# Patient Record
Sex: Male | Born: 1970 | Race: White | Hispanic: No | Marital: Married | Smoking: Former smoker
Health system: Southern US, Community
[De-identification: ages and names within clinical notes are randomized; demographics above are authoritative.]

## PROBLEM LIST (undated history)

## (undated) DIAGNOSIS — F419 Anxiety disorder, unspecified: Secondary | ICD-10-CM

## (undated) DIAGNOSIS — J189 Pneumonia, unspecified organism: Secondary | ICD-10-CM

## (undated) DIAGNOSIS — I10 Essential (primary) hypertension: Secondary | ICD-10-CM

## (undated) DIAGNOSIS — K4020 Bilateral inguinal hernia, without obstruction or gangrene, not specified as recurrent: Secondary | ICD-10-CM

## (undated) DIAGNOSIS — K219 Gastro-esophageal reflux disease without esophagitis: Secondary | ICD-10-CM

## (undated) DIAGNOSIS — Z87442 Personal history of urinary calculi: Secondary | ICD-10-CM

## (undated) DIAGNOSIS — J449 Chronic obstructive pulmonary disease, unspecified: Secondary | ICD-10-CM

## (undated) DIAGNOSIS — R251 Tremor, unspecified: Secondary | ICD-10-CM

## (undated) DIAGNOSIS — G4730 Sleep apnea, unspecified: Secondary | ICD-10-CM

## (undated) DIAGNOSIS — L409 Psoriasis, unspecified: Secondary | ICD-10-CM

## (undated) HISTORY — PX: OTHER SURGICAL HISTORY: SHX169

## (undated) HISTORY — PX: KNEE SURGERY: SHX244

## (undated) HISTORY — PX: BURR HOLE W/ STEREOTACTIC INSERTION OF DBS LEADS / INTRAOP MICROELECTRODE RECORDING: SUR171

---

## 2015-10-07 DIAGNOSIS — F17210 Nicotine dependence, cigarettes, uncomplicated: Secondary | ICD-10-CM | POA: Insufficient documentation

## 2015-10-07 DIAGNOSIS — N529 Male erectile dysfunction, unspecified: Secondary | ICD-10-CM | POA: Insufficient documentation

## 2015-10-07 DIAGNOSIS — G250 Essential tremor: Secondary | ICD-10-CM | POA: Insufficient documentation

## 2016-03-08 ENCOUNTER — Encounter (HOSPITAL_COMMUNITY): Payer: Self-pay | Admitting: Family Medicine

## 2016-03-08 ENCOUNTER — Ambulatory Visit (HOSPITAL_COMMUNITY)
Admission: EM | Admit: 2016-03-08 | Discharge: 2016-03-08 | Disposition: A | Discharge status: Home / Self Care | Payer: BLUE CROSS/BLUE SHIELD | Attending: Family Medicine | Admitting: Family Medicine

## 2016-03-08 DIAGNOSIS — R05 Cough: Secondary | ICD-10-CM

## 2016-03-08 DIAGNOSIS — J40 Bronchitis, not specified as acute or chronic: Secondary | ICD-10-CM

## 2016-03-08 DIAGNOSIS — R059 Cough, unspecified: Secondary | ICD-10-CM

## 2016-03-08 MED ORDER — BENZONATATE 100 MG PO CAPS: 200.0000 mg | ORAL_CAPSULE | Freq: Three times a day (TID) | ORAL | 0 refills | Status: DC | PRN

## 2016-03-08 MED ORDER — PREDNISONE 10 MG PO TABS: 20.0000 mg | ORAL_TABLET | Freq: Every day | ORAL | 0 refills | Status: DC

## 2016-03-08 MED ORDER — AZITHROMYCIN 250 MG PO TABS: 250.0000 mg | ORAL_TABLET | Freq: Every day | ORAL | 0 refills | Status: DC

## 2016-03-08 MED ORDER — ALBUTEROL SULFATE HFA 108 (90 BASE) MCG/ACT IN AERS: 2.0000 | INHALATION_SPRAY | RESPIRATORY_TRACT | 0 refills | Status: DC | PRN

## 2016-03-08 NOTE — ED Triage Notes (Signed)
Pt here for cough, congestion, sore throat since Friday.

## 2016-03-08 NOTE — ED Provider Notes (Signed)
CSN: [id number]     Arrival date & time 03/08/16  1314 History   None    Chief Complaint  Patient presents with  . Cough  . Nasal Congestion   (Consider location/radiation/quality/duration/timing/severity/associated sxs/prior Treatment) Patient c/o cough and uri sx's for 5 days.   The history is provided by the patient.  Cough  Cough characteristics:  Productive Sputum characteristics:  Yellow Severity:  Moderate Onset quality:  Sudden Duration:  5 days Timing:  Intermittent Chronicity:  New Smoker: no   Relieved by:  Nothing Worsened by:  Nothing Ineffective treatments:  None tried Associated symptoms: rhinorrhea     History reviewed. No pertinent past medical history. History reviewed. No pertinent surgical history. History reviewed. No pertinent family history. Social History  Substance Use Topics  . Smoking status: Current Every Day Smoker  . Smokeless tobacco: Never Used  . Alcohol use Not on file    Review of Systems  Constitutional: Positive for fatigue.  HENT: Positive for rhinorrhea.   Eyes: Negative.   Respiratory: Positive for cough.   Gastrointestinal: Negative.   Endocrine: Negative.   Genitourinary: Negative.   Musculoskeletal: Negative.   Allergic/Immunologic: Negative.   Neurological: Negative.     Allergies  Penicillins  Home Medications   Prior to Admission medications   Medication Sig Start Date End Date Taking? Authorizing Provider  atenolol (TENORMIN) 25 MG tablet Take by mouth daily.   Yes Historical Provider, MD  albuterol (PROVENTIL HFA;VENTOLIN HFA) 108 (90 Base) MCG/ACT inhaler Inhale 2 puffs into the lungs every 4 (four) hours as needed for wheezing or shortness of breath. 03/08/16   Lysbeth Penner, FNP  azithromycin (ZITHROMAX) 250 MG tablet Take 1 tablet (250 mg total) by mouth daily. Take first 2 tablets together, then 1 every day until finished. 03/08/16   Lysbeth Penner, FNP  benzonatate (TESSALON) 100 MG capsule Take 2  capsules (200 mg total) by mouth 3 (three) times daily as needed for cough. 03/08/16   Lysbeth Penner, FNP  predniSONE (DELTASONE) 10 MG tablet Take 2 tablets (20 mg total) by mouth daily. 03/08/16   Lysbeth Penner, FNP   Meds Ordered and Administered this Visit  Medications - No data to display  BP 134/80   Pulse 77   Temp 97.4 F (36.3 C)   Resp 18   SpO2 98%  No data found.   Physical Exam  Constitutional: He appears well-developed and well-nourished.  HENT:  Head: Normocephalic and atraumatic.  Right Ear: External ear normal.  Left Ear: External ear normal.  Mouth/Throat: Oropharynx is clear and moist.  Eyes: Conjunctivae and EOM are normal. Pupils are equal, round, and reactive to light.  Neck: Normal range of motion. Neck supple.  Cardiovascular: Normal rate, regular rhythm and normal heart sounds.   Pulmonary/Chest: Effort normal and breath sounds normal.  Abdominal: Soft. Bowel sounds are normal.  Nursing note and vitals reviewed.   Urgent Care Course   Clinical Course     Procedures (including critical care time)  Labs Review Labs Reviewed - No data to display  Imaging Review No results found.   Visual Acuity Review  Right Eye Distance:   Left Eye Distance:   Bilateral Distance:    Right Eye Near:   Left Eye Near:    Bilateral Near:         MDM   1. Bronchitis   2. Cough    zithromax Prednisone 20mg  po qd x 5 d [employer]  po fluids, rest, tylenol and motrin otc prn as directed for fever, arthralgias, and myalgias.  Follow up prn if sx's continue or persist.    Lysbeth Penner, FNP 03/08/16 [phone]

## 2016-09-13 ENCOUNTER — Encounter (HOSPITAL_COMMUNITY): Payer: Self-pay | Admitting: Vascular Surgery

## 2016-09-13 ENCOUNTER — Emergency Department (HOSPITAL_COMMUNITY)
Admission: EM | Admit: 2016-09-13 | Discharge: 2016-09-14 | Disposition: A | Discharge status: Home / Self Care | Payer: BLUE CROSS/BLUE SHIELD | Source: Home / Self Care | Attending: Physician Assistant | Admitting: Physician Assistant

## 2016-09-13 ENCOUNTER — Emergency Department (HOSPITAL_COMMUNITY): Payer: BLUE CROSS/BLUE SHIELD

## 2016-09-13 DIAGNOSIS — F17210 Nicotine dependence, cigarettes, uncomplicated: Secondary | ICD-10-CM | POA: Insufficient documentation

## 2016-09-13 DIAGNOSIS — J189 Pneumonia, unspecified organism: Secondary | ICD-10-CM

## 2016-09-13 DIAGNOSIS — J441 Chronic obstructive pulmonary disease with (acute) exacerbation: Secondary | ICD-10-CM | POA: Diagnosis not present

## 2016-09-13 DIAGNOSIS — Z79899 Other long term (current) drug therapy: Secondary | ICD-10-CM | POA: Diagnosis not present

## 2016-09-13 DIAGNOSIS — R079 Chest pain, unspecified: Secondary | ICD-10-CM | POA: Diagnosis present

## 2016-09-13 DIAGNOSIS — R091 Pleurisy: Secondary | ICD-10-CM | POA: Diagnosis not present

## 2016-09-13 DIAGNOSIS — R0602 Shortness of breath: Secondary | ICD-10-CM | POA: Insufficient documentation

## 2016-09-13 HISTORY — DX: Psoriasis, unspecified: L40.9

## 2016-09-13 LAB — BASIC METABOLIC PANEL
Anion gap: 9 (ref 5–15)
BUN: 11 mg/dL (ref 6–20)
CO2: 23 mmol/L (ref 22–32)
Calcium: 8.9 mg/dL (ref 8.9–10.3)
Chloride: 102 mmol/L (ref 101–111)
Creatinine, Ser: 0.97 mg/dL (ref 0.61–1.24)
GFR calc Af Amer: >60 mL/min (ref >60)
GFR calc non Af Amer: >60 mL/min (ref >60)
Glucose, Bld: 131 mg/dL — ABNORMAL HIGH (ref 65–99)
Potassium: 3.7 mmol/L (ref 3.5–5.1)
Sodium: 134 mmol/L — ABNORMAL LOW (ref 135–145)

## 2016-09-13 LAB — CBC
HCT: 43.6 % (ref 39.0–52.0)
Hemoglobin: 14.9 g/dL (ref 13.0–17.0)
MCH: 30.6 pg (ref 26.0–34.0)
MCHC: 34.2 g/dL (ref 30.0–36.0)
MCV: 89.5 fL (ref 78.0–100.0)
Platelets: 153 10*3/uL (ref 150–400)
RBC: 4.87 MIL/uL (ref 4.22–5.81)
RDW: 13.1 % (ref 11.5–15.5)
WBC: 8.2 10*3/uL (ref 4.0–10.5)

## 2016-09-13 LAB — I-STAT TROPONIN, ED
Troponin i, poc: 0.01 ng/mL (ref 0.00–0.08)
Troponin i, poc: 0.05 ng/mL (ref 0.00–0.08)

## 2016-09-13 MED ORDER — IPRATROPIUM-ALBUTEROL 0.5-2.5 (3) MG/3ML IN SOLN
3.0000 mL | Freq: Once | RESPIRATORY_TRACT | Status: AC
  Administered 2016-09-13: 3 mL via RESPIRATORY_TRACT
  Filled 2016-09-13: qty 3

## 2016-09-13 MED ORDER — PREDNISONE 20 MG PO TABS
40.0000 mg | ORAL_TABLET | Freq: Once | ORAL | Status: AC
  Administered 2016-09-13: 40 mg via ORAL
  Filled 2016-09-13: qty 2

## 2016-09-13 MED ORDER — AZITHROMYCIN 250 MG PO TABS
500.0000 mg | ORAL_TABLET | Freq: Once | ORAL | Status: AC
  Administered 2016-09-13: 500 mg via ORAL
  Filled 2016-09-13: qty 2

## 2016-09-13 NOTE — ED Triage Notes (Signed)
Pt reports to the ED for eval of left sided sharp CP that began tonight while he was driving. Pain is worse with movement and deep breaths. Pt reports associated symptoms of nausea. Denies any other associated symptoms. Reports it does radiate to his left arm.

## 2016-09-13 NOTE — ED Provider Notes (Signed)
[facility] DEPT Provider Note   CSN: [id number] Arrival date & time: 09/13/16  1732     History   Chief Complaint Chief Complaint  Patient presents with  . Chest Pain    HPI Alexander Kennedy is a 46 y.o. male.  HPI   Patient is a 46 year old male presenting with chest pain, worse with breathing. And cough. 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 Patient had no extremity swelling. Has no tachycardia here. 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 Heavy smoker. The chest pain is located in the left side chest pain, was worse with breathing. No associated with diaphoresis.  Past Medical History:  Diagnosis Date  . Psoriasis     There are no active problems to display for this patient.   Past Surgical History:  Procedure Laterality Date  . KNEE SURGERY Left        Home Medications    Prior to Admission medications   Medication Sig Start Date End Date Taking? Authorizing Provider  albuterol (PROVENTIL HFA;VENTOLIN HFA) 108 (90 Base) MCG/ACT inhaler Inhale 2 puffs into the lungs every 4 (four) hours as needed for wheezing or shortness of breath. 03/08/16   Lysbeth Penner, FNP  atenolol (TENORMIN) 25 MG tablet Take by mouth daily.    [provider]  azithromycin (ZITHROMAX) 250 MG tablet Take 1 tablet (250 mg total) by mouth daily. Take first 2 tablets together, then 1 every day until finished. 03/08/16   Lysbeth Penner, FNP  benzonatate (TESSALON) 100 MG capsule Take 2 capsules (200 mg total) by mouth 3 (three) times daily as needed for cough. 03/08/16   Lysbeth Penner, FNP  predniSONE (DELTASONE) 10 MG tablet Take 2 tablets (20 mg total) by mouth daily. 03/08/16   Lysbeth Penner, FNP    Family History No family history on file.  Social History Social History  Substance Use Topics  . Smoking status: Current Every Day Smoker    Packs/day: 1.00    Types: Cigarettes  . Smokeless tobacco: Never Used  .  Alcohol use No     Allergies   Penicillins   Review of Systems Review of Systems  Constitutional: Positive for fatigue. Negative for activity change and fever.  Respiratory: Positive for cough, chest tightness and shortness of breath.   Cardiovascular: Negative for chest pain.  Gastrointestinal: Negative for abdominal pain.  Neurological: Negative for light-headedness.     Physical Exam Updated Vital Signs BP 132/89 (BP Location: Left Arm)   Pulse 72   Temp 98.1 F (36.7 C) (Oral)   Resp 16   SpO2 96%   Physical Exam  Constitutional: He is oriented to person, place, and time. He appears well-nourished.  HENT:  Head: Normocephalic.  Eyes: Conjunctivae are normal.  Cardiovascular: Normal rate and regular rhythm.   Pulmonary/Chest: No respiratory distress. He has wheezes.  Patient has diffuse wheezing, rhonchi.  Abdominal: Soft. He exhibits no distension. There is no tenderness.  Musculoskeletal: He exhibits no edema.  Neurological: He is oriented to person, place, and time.  Skin: Skin is warm and dry. He is not diaphoretic.  Psychiatric: He has a normal mood and affect. His behavior is normal.     ED Treatments / Results  Labs (all labs ordered are listed, but only abnormal results are displayed) Labs Reviewed  BASIC METABOLIC PANEL - Abnormal; Notable for the following:       Result Value   Sodium 134 (*)  Glucose, Bld 131 (*)    All other components within normal limits  CBC  I-STAT TROPONIN, ED  I-STAT TROPONIN, ED    EKG  EKG Interpretation  Date/Time:  Tuesday September 13 2016 17:44:28 EDT Ventricular Rate:  71 PR Interval:  150 QRS Duration: 90 QT Interval:  384 QTC Calculation: 417 R Axis:   82 Text Interpretation:  Normal sinus rhythm Normal ECG Normal sinus rhythm Confirmed by Thomasene Lot, [place] [id number]) on 09/13/2016 9:10:15 PM       Radiology Dg Chest 2 View  Result Date: 09/13/2016 CLINICAL DATA:  Left-sided chest pain.  Nausea. EXAM:  CHEST  2 VIEW COMPARISON:  None. FINDINGS: Ill-defined lingular opacity. Right lung is clear. The cardiomediastinal contours are normal. The lungs are clear. Pulmonary vasculature is normal. No pleural effusion or pneumothorax. No acute osseous abnormalities are seen. IMPRESSION: Ill-defined lingular opacity is nonspecific, could reflect pneumonia in the appropriate clinical setting. Atelectasis is also considered. Electronically Signed   By: Jeb Levering M.D.   On: 09/13/2016 19:10    Procedures Procedures (including critical care time)  Medications Ordered in ED Medications  ipratropium-albuterol (DUONEB) 0.5-2.5 (3) MG/3ML nebulizer solution 3 mL (not administered)  azithromycin (ZITHROMAX) tablet 500 mg (not administered)  predniSONE (DELTASONE) tablet 40 mg (not administered)     Initial Impression / Assessment and Plan / ED Course  I have reviewed the triage vital signs and the nursing notes.  Pertinent labs & imaging results that were available during my care of the patient were reviewed by me and considered in my medical decision making (see chart for details).   this is a 46 year old male, current smoker, diagnosis of COPD, presenting today with cough, and chest pain. On exam patient has diffuse wheezing. Which likely causing his test chest tightness. We'll give him do nebs, prednisone. His chest x-rays showing a questionable pneumonia. We'll treat as community acquired pneumonia. Doubt PE.   Final Clinical Impressions(s) / ED Diagnoses   Final diagnoses:  None    New Prescriptions New Prescriptions   No medications on file     Macarthur Critchley, MD 09/13/16 2232

## 2016-09-14 ENCOUNTER — Other Ambulatory Visit: Payer: Self-pay

## 2016-09-14 ENCOUNTER — Emergency Department (HOSPITAL_COMMUNITY): Payer: BLUE CROSS/BLUE SHIELD

## 2016-09-14 ENCOUNTER — Encounter (HOSPITAL_COMMUNITY): Payer: Self-pay | Admitting: *Deleted

## 2016-09-14 ENCOUNTER — Emergency Department (HOSPITAL_COMMUNITY)
Admission: EM | Admit: 2016-09-14 | Discharge: 2016-09-14 | Disposition: A | Discharge status: Home / Self Care | Payer: BLUE CROSS/BLUE SHIELD | Attending: Emergency Medicine | Admitting: Emergency Medicine

## 2016-09-14 DIAGNOSIS — R091 Pleurisy: Secondary | ICD-10-CM

## 2016-09-14 DIAGNOSIS — J441 Chronic obstructive pulmonary disease with (acute) exacerbation: Secondary | ICD-10-CM

## 2016-09-14 DIAGNOSIS — Z79899 Other long term (current) drug therapy: Secondary | ICD-10-CM | POA: Insufficient documentation

## 2016-09-14 DIAGNOSIS — F17210 Nicotine dependence, cigarettes, uncomplicated: Secondary | ICD-10-CM | POA: Insufficient documentation

## 2016-09-14 HISTORY — DX: Pneumonia, unspecified organism: J18.9

## 2016-09-14 LAB — TROPONIN I: Troponin I: <0.03 ng/mL (ref <0.03)

## 2016-09-14 LAB — CBC WITH DIFFERENTIAL/PLATELET
Basophils Absolute: 0 10*3/uL (ref 0.0–0.1)
Basophils Relative: 0 %
Eosinophils Absolute: 0.1 10*3/uL (ref 0.0–0.7)
Eosinophils Relative: 1 %
HCT: 43.9 % (ref 39.0–52.0)
Hemoglobin: 15.6 g/dL (ref 13.0–17.0)
Lymphocytes Relative: 24 %
Lymphs Abs: 2.2 10*3/uL (ref 0.7–4.0)
MCH: 31.9 pg (ref 26.0–34.0)
MCHC: 35.5 g/dL (ref 30.0–36.0)
MCV: 89.8 fL (ref 78.0–100.0)
Monocytes Absolute: 0.5 10*3/uL (ref 0.1–1.0)
Monocytes Relative: 5 %
Neutro Abs: 6.2 10*3/uL (ref 1.7–7.7)
Neutrophils Relative %: 70 %
Platelets: 140 10*3/uL — ABNORMAL LOW (ref 150–400)
RBC: 4.89 MIL/uL (ref 4.22–5.81)
RDW: 12.7 % (ref 11.5–15.5)
WBC: 8.9 10*3/uL (ref 4.0–10.5)

## 2016-09-14 LAB — COMPREHENSIVE METABOLIC PANEL
ALT: 16 U/L — ABNORMAL LOW (ref 17–63)
AST: 14 U/L — ABNORMAL LOW (ref 15–41)
Albumin: 4.2 g/dL (ref 3.5–5.0)
Alkaline Phosphatase: 44 U/L (ref 38–126)
Anion gap: 7 (ref 5–15)
BUN: 13 mg/dL (ref 6–20)
CO2: 24 mmol/L (ref 22–32)
Calcium: 8.8 mg/dL — ABNORMAL LOW (ref 8.9–10.3)
Chloride: 103 mmol/L (ref 101–111)
Creatinine, Ser: 0.84 mg/dL (ref 0.61–1.24)
GFR calc Af Amer: >60 mL/min (ref >60)
GFR calc non Af Amer: >60 mL/min (ref >60)
Glucose, Bld: 92 mg/dL (ref 65–99)
Potassium: 4 mmol/L (ref 3.5–5.1)
Sodium: 134 mmol/L — ABNORMAL LOW (ref 135–145)
Total Bilirubin: 0.9 mg/dL (ref 0.3–1.2)
Total Protein: 6.6 g/dL (ref 6.5–8.1)

## 2016-09-14 LAB — LIPASE, BLOOD: Lipase: 29 U/L (ref 11–51)

## 2016-09-14 MED ORDER — IBUPROFEN 600 MG PO TABS: 600.0000 mg | ORAL_TABLET | Freq: Four times a day (QID) | ORAL | 0 refills | Status: DC | PRN

## 2016-09-14 MED ORDER — KETOROLAC TROMETHAMINE 30 MG/ML IJ SOLN
30.0000 mg | Freq: Once | INTRAMUSCULAR | Status: AC
  Administered 2016-09-14: 30 mg via INTRAVENOUS
  Filled 2016-09-14: qty 1

## 2016-09-14 MED ORDER — ALBUTEROL SULFATE HFA 108 (90 BASE) MCG/ACT IN AERS: 1.0000 | INHALATION_SPRAY | RESPIRATORY_TRACT | Status: DC | PRN

## 2016-09-14 MED ORDER — TRAMADOL HCL 50 MG PO TABS: 50.0000 mg | ORAL_TABLET | Freq: Four times a day (QID) | ORAL | 0 refills | Status: DC | PRN

## 2016-09-14 MED ORDER — AZITHROMYCIN 250 MG PO TABS: 250.0000 mg | ORAL_TABLET | Freq: Every day | ORAL | 0 refills | Status: DC

## 2016-09-14 MED ORDER — ALBUTEROL SULFATE HFA 108 (90 BASE) MCG/ACT IN AERS: 1.0000 | INHALATION_SPRAY | Freq: Four times a day (QID) | RESPIRATORY_TRACT | 0 refills | Status: DC | PRN

## 2016-09-14 MED ORDER — METHYLPREDNISOLONE SODIUM SUCC 125 MG IJ SOLR
125.0000 mg | Freq: Once | INTRAMUSCULAR | Status: AC
  Administered 2016-09-14: 125 mg via INTRAVENOUS
  Filled 2016-09-14: qty 2

## 2016-09-14 MED ORDER — IOPAMIDOL (ISOVUE-370) INJECTION 76%
100.0000 mL | Freq: Once | INTRAVENOUS | Status: AC | PRN
  Administered 2016-09-14: 100 mL via INTRAVENOUS

## 2016-09-14 MED ORDER — ALBUTEROL (5 MG/ML) CONTINUOUS INHALATION SOLN
10.0000 mg/h | INHALATION_SOLUTION | RESPIRATORY_TRACT | Status: DC
  Administered 2016-09-14: 10 mg/h via RESPIRATORY_TRACT
  Filled 2016-09-14: qty 20

## 2016-09-14 MED ORDER — MORPHINE SULFATE (PF) 2 MG/ML IV SOLN
2.0000 mg | Freq: Once | INTRAVENOUS | Status: AC
  Administered 2016-09-14: 2 mg via INTRAVENOUS
  Filled 2016-09-14: qty 1

## 2016-09-14 NOTE — ED Provider Notes (Signed)
[facility] DEPT Provider Note   CSN: [id number] Arrival date & time: 09/14/16  1352     History   Chief Complaint Chief Complaint  Patient presents with  . Chest Pain    HPI Alexander Kennedy is a 46 y.o. male.  HPI Patient was seen yesterday and diagnosed with COPD exacerbation and possible left-sided community-acquired pneumonia. Patient states that he developed left-sided chest pain yesterday afternoon while driving. Describes the pain as stabbing like radiating to his left shoulder. Pain is worse with deep breathing and coughing. Patient has had coughing without sputum production. 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 Denies any new lower extremity swelling or pain. States he's had ongoing left-sided chest pain since onset. He's been taking ibuprofen with little relief. Was given antibiotics in the emergency department yesterday but has yet to fill his home prescription. States his father died of MI in his 26s. Patient with 30+ year smoking history. Past Medical History:  Diagnosis Date  . Pneumonia   . Psoriasis     There are no active problems to display for this patient.   Past Surgical History:  Procedure Laterality Date  . KNEE SURGERY Left        Home Medications    Prior to Admission medications   Medication Sig Start Date End Date Taking? Authorizing Provider  azithromycin (ZITHROMAX Z-PAK) 250 MG tablet Take 1 tablet (250 mg total) by mouth daily. Patient taking differently: Take 250 mg by mouth daily. Prescription not filled yet. 09/14/16  Yes Upstill, Nehemiah Settle, PA-C  Cyanocobalamin (B-12 PO) Take 2 tablets by mouth daily after breakfast.   Yes [provider]  HUMIRA PEN 40 MG/0.8ML PNKT Inject 40 mg into the skin every 14 (fourteen) days.  08/20/16  Yes [provider]  predniSONE (DELTASONE) 10 MG tablet Take 2 tablets (20 mg total) by mouth daily. 03/08/16  Yes Lysbeth Penner, FNP  primidone (MYSOLINE) 50 MG tablet Take  100 mg by mouth 2 (two) times daily. MORNING and LUNCHTIME(midday) 09/10/16  Yes [provider]  propranolol (INDERAL) 40 MG tablet Take 80 mg by mouth 2 (two) times daily. MORNING and EVENING 08/12/16  Yes [provider]  ibuprofen (ADVIL,MOTRIN) 600 MG tablet Take 1 tablet (600 mg total) by mouth every 6 (six) hours as needed for moderate pain. 09/14/16   Julianne Rice, MD  traMADol (ULTRAM) 50 MG tablet Take 1 tablet (50 mg total) by mouth every 6 (six) hours as needed. 09/14/16   Julianne Rice, MD    Family History No family history on file.  Social History Social History  Substance Use Topics  . Smoking status: Current Every Day Smoker    Packs/day: 1.00    Types: Cigarettes  . Smokeless tobacco: Never Used  . Alcohol use No     Allergies   Penicillins   Review of Systems Review of Systems  Constitutional: Negative for chills and fever.  HENT: Negative for congestion, sinus pressure and sore throat.   Respiratory: Positive for cough, shortness of breath and wheezing.   Cardiovascular: Positive for chest pain. Negative for palpitations and leg swelling.  Gastrointestinal: Negative for abdominal pain, constipation, diarrhea, nausea and vomiting.  Genitourinary: Negative for flank pain, frequency and hematuria.  Musculoskeletal: Positive for back pain. Negative for neck pain and neck stiffness.  Neurological: Negative for dizziness, weakness, light-headedness and numbness.  All other systems reviewed and are negative.    Physical Exam Updated Vital Signs BP  129/75   Pulse (!) 56   Temp 97.7 F (36.5 C) (Oral)   Resp 17   Ht 5\' 7"  (1.702 m)   Wt 99.8 kg (220 lb)   SpO2 97%   BMI 34.46 kg/m   Physical Exam  Constitutional: He is oriented to person, place, and time. He appears well-developed and well-nourished. No distress.  HENT:  Head: Normocephalic and atraumatic.  Mouth/Throat: Oropharynx is clear and moist. No oropharyngeal exudate.    Eyes: Pupils are equal, round, and reactive to light. EOM are normal.  Neck: Normal range of motion. Neck supple. No JVD present.  Cardiovascular: Normal rate and regular rhythm.  Exam reveals no gallop and no friction rub.   No murmur heard. Pulmonary/Chest: Effort normal. He has wheezes.  Diffuse expiratory wheezing throughout.  Abdominal: Soft. Bowel sounds are normal. There is no tenderness. There is no rebound and no guarding.  Musculoskeletal: Normal range of motion. He exhibits no edema or tenderness.  No midline thoracic or lumbar tenderness. No CVA tenderness. No lower extremity swelling, asymmetry or tenderness. Distal pulses are 2+.  Neurological: He is alert and oriented to person, place, and time.  Moving all extremities without focal deficit. Sensation fully intact.  Skin: Skin is warm and dry. Capillary refill takes less than 2 seconds. No rash noted. He is not diaphoretic. No erythema.  Psychiatric: He has a normal mood and affect. His behavior is normal.  Nursing note and vitals reviewed.    ED Treatments / Results  Labs (all labs ordered are listed, but only abnormal results are displayed) Labs Reviewed  CBC WITH DIFFERENTIAL/PLATELET - Abnormal; Notable for the following:       Result Value   Platelets 140 (*)    All other components within normal limits  COMPREHENSIVE METABOLIC PANEL - Abnormal; Notable for the following:    Sodium 134 (*)    Calcium 8.8 (*)    AST 14 (*)    ALT 16 (*)    All other components within normal limits  LIPASE, BLOOD  TROPONIN I    EKG  EKG Interpretation  Date/Time:  Wednesday September 14 2016 14:06:20 EDT Ventricular Rate:  76 PR Interval:    QRS Duration: 92 QT Interval:  371 QTC Calculation: 418 R Axis:   39 Text Interpretation:  Sinus rhythm Ventricular premature complex Low voltage, extremity leads Confirmed by Lita Mains  MD, Kisean Rollo ([id number]) on 09/14/2016 2:54:24 PM       Radiology Dg Chest 2 View  Result Date:  09/13/2016 CLINICAL DATA:  Left-sided chest pain.  Nausea. EXAM: CHEST  2 VIEW COMPARISON:  None. FINDINGS: Ill-defined lingular opacity. Right lung is clear. The cardiomediastinal contours are normal. The lungs are clear. Pulmonary vasculature is normal. No pleural effusion or pneumothorax. No acute osseous abnormalities are seen. IMPRESSION: Ill-defined lingular opacity is nonspecific, could reflect pneumonia in the appropriate clinical setting. Atelectasis is also considered. Electronically Signed   By: Jeb Levering M.D.   On: 09/13/2016 19:10   Ct Angio Chest Pe W And/or Wo Contrast  Result Date: 09/14/2016 CLINICAL DATA:  Left-sided chest pain for 1 day EXAM: CT ANGIOGRAPHY CHEST WITH CONTRAST TECHNIQUE: Multidetector CT imaging of the chest was performed using the standard protocol during bolus administration of intravenous contrast. Multiplanar CT image reconstructions and MIPs were obtained to evaluate the vascular anatomy. CONTRAST:  100 mL Isovue 370. COMPARISON:  09/13/2016 FINDINGS: Cardiovascular: The thoracic aorta is well visualize without aneurysmal dilatation or atherosclerotic calcifications. No significant  coronary calcifications are seen. No cardiac enlargement is noted. The pulmonary artery shows a normal branching pattern without filling defect to suggest pulmonary embolism. Mediastinum/Nodes: No significant hilar or mediastinal adenopathy is seen. The thoracic inlet is within normal limits. The esophagus as visualized is unremarkable. Lungs/Pleura: The lungs are well aerated bilaterally. Very minimal lingular changes are noted slightly improved from the prior exam likely related to atelectasis. No focal confluent infiltrate is seen. No nodular density or pleural effusion is seen. Upper Abdomen: Visualized upper abdomen is within normal limits. Musculoskeletal: No acute bony abnormality is noted. Old healed posterior left rib fractures are noted. Review of the MIP images confirms the  above findings. IMPRESSION: No evidence of pulmonary emboli. Improvement in the lingular changes when compared with the prior exam. This may have simply represented atelectasis on the previous exam. No acute abnormality is noted. Electronically Signed   By: Inez Catalina M.D.   On: 09/14/2016 15:22    Procedures Procedures (including critical care time)  Medications Ordered in ED Medications  morphine 2 MG/ML injection 2 mg (2 mg Intravenous Given 09/14/16 1440)  iopamidol (ISOVUE-370) 76 % injection 100 mL (100 mLs Intravenous Contrast Given 09/14/16 1507)  ketorolac (TORADOL) 30 MG/ML injection 30 mg (30 mg Intravenous Given 09/14/16 1620)  methylPREDNISolone sodium succinate (SOLU-MEDROL) 125 mg/2 mL injection 125 mg (125 mg Intravenous Given 09/14/16 1620)     Initial Impression / Assessment and Plan / ED Course  I have reviewed the triage vital signs and the nursing notes.  Pertinent labs & imaging results that were available during my care of the patient were reviewed by me and considered in my medical decision making (see chart for details).    CT angiogram chest without evidence of PE. No definite evidence of pneumonia. Normal white blood cell count. Advised to continue prednisone and given albuterol inhaler. Given the patient does have risk factors for coronary artery disease he's advised also to follow-up with cardiology. Return precautions have been given.  Final Clinical Impressions(s) / ED Diagnoses   Final diagnoses:  Pleurisy  COPD exacerbation ([place])    New Prescriptions Discharge Medication List as of 09/14/2016  6:21 PM    START taking these medications   Details  traMADol (ULTRAM) 50 MG tablet Take 1 tablet (50 mg total) by mouth every 6 (six) hours as needed., Starting Wed 09/14/2016, Print         Julianne Rice, MD 09/15/16 1816

## 2016-09-14 NOTE — ED Triage Notes (Signed)
Pt c/o left sided CP that began yesterday. Pt reports he was seen at [facility] yesterday and dx with PNA but his chest pain wasn't addressed and is still occurring. Pt reports the pain is intermittent and describes it as stabbing. Denies nausea, vomiting, dizziness, lightheadedness. Reports SOB.

## 2016-09-14 NOTE — Discharge Instructions (Signed)
Follow up with your doctor as needed for persistent symptoms. Return here with any worsening symptoms or new concerns.

## 2016-09-14 NOTE — Discharge Instructions (Signed)
Fill your antibiotic and prednisone prescription and take as directed. Follow-up with cardiology and establish care with primary physician.

## 2016-12-21 ENCOUNTER — Encounter (INDEPENDENT_AMBULATORY_CARE_PROVIDER_SITE_OTHER): Payer: Self-pay

## 2016-12-21 ENCOUNTER — Ambulatory Visit (INDEPENDENT_AMBULATORY_CARE_PROVIDER_SITE_OTHER): Payer: BLUE CROSS/BLUE SHIELD | Admitting: Neurology

## 2016-12-21 ENCOUNTER — Encounter: Payer: Self-pay | Admitting: Neurology

## 2016-12-21 VITALS — BP 124/80 | HR 60 | Ht 67.0 in | Wt 234.0 lb

## 2016-12-21 DIAGNOSIS — G250 Essential tremor: Secondary | ICD-10-CM

## 2016-12-21 MED ORDER — PRIMIDONE 50 MG PO TABS: ORAL_TABLET | ORAL | 5 refills | Status: DC

## 2016-12-21 NOTE — Patient Instructions (Addendum)
You have a history of tremors. Your history and physical exam are in keeping with a diagnosis of essential tremor. As you know, tremors do get worse with time. There is no cure for tremor.  You are on a high dose of propranolol and I would not recommend you increase this at this time.   You have been on mysoline. We can try going up cautiously, please keep in mind, that it can cause you to be sleepy!  Mysoline (primidone) 50 mg strength: Take 2 pills in AM, 2 at lunch and 1 at bedtime for 2 weeks, then 2 pills 3 times a day thereafter.  Common side effects reported are: Sleepiness, drowsiness, balance problems, confusion, and GI related symptoms.  As discussed, I will make a referral to Dr. Linus Mako at [facility] for evaluation for deep brain stimulation (DBS) for treatment of advanced essential tremor.  You should hear back from the neurology department at [facility] directly. If you don't hear back in the next couple of weeks from them, call us and we will inquire about the status of the referral. Please keep in mind, that it may take several weeks or even a few months to get in to see the neurologist.  Please try to reduce your caffeine/soda intake as this may help reduce your day to day tremor.   Please establish care with a new primary care physician/family doctor.   You may be at risk for sleep apnea, based on your exam. Please consider doing a sleep study. 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 Prior sleep study testing per your report was about 8 years ago; you may benefit from re-evaluation.

## 2016-12-21 NOTE — Progress Notes (Signed)
Subjective:    Patient ID: [place] is a 46 y.o. male.  HPI     [place] Age, MD, PhD [facility] [address] [place], [place] [address]   [place],   I saw your patient, [place], upon your kind request, in my neurologic clinic today, for initial consultation of his tremors. The patient is unaccompanied today. As you know, [place] is a 46 -year-old right-handed gentleman with an underlying medical history of psoriasis, history of pneumonia, smoking, thrombocytopenia, and obesity, who reports a long-standing history of bilateral upper extremity tremors. He has had tremors for at least 10 years. He has been seeing you for the past year or 2. He does not drink alcohol on a regular basis. He drinks caffeine in the form of diet soda, about 4 bottles per day if the 16.9 ounce bottles. He smokes about 1 pack per day. He lives in [place]. His wife lives in [place] with her mother. He has 3 grown children. He recalls that his paternal grandmother had significant tremors and was at some point unable to write any longer. 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 He had a sleep study some 8 years ago. He was told at the time that he does not have obstructive sleep apnea as I understand. He reports that at his most recent DOT exam his DOT physician was on the fence regarding recommending sleep study testing for him. The patient does snore. He has occasional nocturia. He feels like his weight has remained stable over the past few years. Tremor has been progressive as well.  For essential tremor he was first tried on atenolol, then switched to propranolol, then Mysoline was added more recently in June 2018. He is currently on propranolol 80 mg twice a day and Mysoline 100 mg in the morning and 100 mg at lunchtime. He does not currently have a primary  care physician.  His Past Medical History Is Significant For: Past Medical History:  Diagnosis Date  . Pneumonia   . Psoriasis     His Past Surgical History Is Significant For: Past Surgical History:  Procedure Laterality Date  . KNEE SURGERY Left     His Family History Is Significant For: No family history on file.  His Social History Is Significant For: Social History   Social History  . Marital status: Married    Spouse name: N/A  . Number of children: N/A  . Years of education: N/A   Social History Main Topics  . Smoking status: Current Every Day Smoker    Packs/day: 1.00    Types: Cigarettes  . Smokeless tobacco: Never Used  . Alcohol use No  . Drug use: No  . Sexual activity: Not Asked   Other Topics Concern  . None   Social History Narrative   Patient lives at home alone.    Patient is right handed.     His Allergies Are:  Allergies  Allergen Reactions  . Penicillins Shortness Of Breath and Other (See Comments)    Dizziness also Has patient had a PCN reaction causing immediate rash, facial/tongue/throat swelling, SOB or lightheadedness with hypotension: Yes Has patient had a PCN reaction causing severe rash involving mucus membranes or skin necrosis: No Has patient had a PCN reaction that required hospitalization: No Has patient had a PCN reaction occurring within the last 10 years:  No If all of the above answers are "NO", then may proceed with Cephalosporin use.   :   His Current Medications Are:  Outpatient Encounter Prescriptions as of 12/21/2016  Medication Sig  . Cyanocobalamin (B-12 PO) Take 2 tablets by mouth daily after breakfast.  . HUMIRA PEN 40 MG/0.8ML PNKT Inject 40 mg into the skin every 14 (fourteen) days.   Marland Kitchen ibuprofen (ADVIL,MOTRIN) 600 MG tablet Take 1 tablet (600 mg total) by mouth every 6 (six) hours as needed for moderate pain.  . primidone (MYSOLINE) 50 MG tablet Take 100 mg by mouth 2 (two) times daily. MORNING and  LUNCHTIME(midday)  . propranolol (INDERAL) 40 MG tablet Take 80 mg by mouth 2 (two) times daily. MORNING and EVENING  . [DISCONTINUED] traMADol (ULTRAM) 50 MG tablet Take 1 tablet (50 mg total) by mouth every 6 (six) hours as needed.  . [DISCONTINUED] azithromycin (ZITHROMAX Z-PAK) 250 MG tablet Take 1 tablet (250 mg total) by mouth daily. (Patient taking differently: Take 250 mg by mouth daily. Prescription not filled yet.)  . [DISCONTINUED] predniSONE (DELTASONE) 10 MG tablet Take 2 tablets (20 mg total) by mouth daily.   No facility-administered encounter medications on file as of 12/21/2016.   : Review of Systems:  Out of a complete 14 point review of systems, all are reviewed and negative with the exception of these symptoms as listed below: Review of Systems  Neurological:       Patient states that the tremors in his hands have been going on for a few years now. He is currently on propranolol and primidone. Patient is right handed.     Objective:  Neurological Exam  Physical Exam Physical Examination:   Vitals:   12/21/16 0951  BP: 124/80  Pulse: 60   General Examination: The patient is a very pleasant 46 y.o. male in no acute distress. He appears well-developed and well-nourished and adequately groomed.   HEENT: Normocephalic, atraumatic, pupils are equal, round and reactive to light and accommodation. Extraocular tracking is good without limitation to gaze excursion or nystagmus noted. Normal smooth pursuit is noted. Hearing is grossly intact. Face is symmetric with normal facial animation and normal facial sensation. Speech is clear with no dysarthria noted. There is no hypophonia. There is no lip, neck/head, jaw or voice tremor. Neck is supple with full range of passive and active motion. There are no carotid bruits on auscultation. Oropharynx exam reveals: mild mouth dryness, adequate dental hygiene and moderate airway crowding, due to larger uvula and redundant soft palate.  Tonsils are absent. Neck circumference is 18-7/8 inches.  Chest: Clear to auscultation without wheezing, rhonchi or crackles noted.  Heart: S1+S2+0, regular and normal without murmurs, rubs or gallops noted.   Abdomen: Soft, non-tender and non-distended with normal bowel sounds appreciated on auscultation.  Extremities: There is no pitting edema in the distal lower extremities bilaterally. Pedal pulses are intact.  Skin: Warm and dry without trophic changes noted.  Musculoskeletal: exam reveals no obvious joint deformities, tenderness or joint swelling or erythema.   Neurologically:  Mental status: The patient is awake, alert and oriented in all 4 spheres. His immediate and remote memory, attention, language skills and fund of knowledge are appropriate. There is no evidence of aphasia, agnosia, apraxia or anomia. Speech is clear with normal prosody and enunciation. Thought process is linear. Mood is normal and affect is normal.  Cranial nerves II - XII are as described above under HEENT exam. In addition: shoulder shrug is normal  with equal shoulder height noted. Motor exam: Normal bulk, strength and tone is noted. There is no drift, resting tremor or rebound.   On 12/21/2016: His handwriting on the intake form is mildly tremulous and legible. When he did the Archimedes spiral today he had severe tremors bilaterally. Handwriting was large and tremulous and nearly illegible.  He has a mild to moderate postural tremor, mild bilateral upper extremity action tremor noted. Fine motor skills with finger taps, hand movements and rapid alternating patting are fairly unremarkable, foot taps and foot agility is unremarkable, heel to shin is a little difficult for him, could be secondary to body habitus. Finger to nose testing is unremarkable, cerebellar testing otherwise shows no dysmetria or intention tremor. He has no truncal or gait ataxia. Sensory exam is intact to light touch throughout. Gait,  station and balance: He stands easily. No veering to one side is noted. No leaning to one side is noted. Posture is [place]-appropriate and stance is narrow based. Gait shows normal stride length and normal pace. No problems turning are noted. Tandem walk is unremarkable.   Assessment and Plan:  In summary, [place] is a very pleasant 46 y.o.-year old male with an underlying medical history of psoriasis, history of pneumonia, smoking, thrombocytopenia, and obesity, who presents for transfer of care and evaluation and ongoing management of his essential tremor. His history and physical exam are in keeping with essential tremor affecting both upper extremities. His history dates back to 10 years ago when he started noticing a tremor for the first time. He has progressed with time. He is advised that tremor disorders are typically progressive. He is also advised that he is already on 2 medications for this condition. I would not recommend increasing the propranolol at this time as his pulse is borderline and blood pressure in the low normal range. He is advised that we can try to increase the Mysoline so long as he does not develop any side effects, particularly daytime somnolence. He is a Administrator. His physical exam is somewhat concerning for possible underlying obstructive sleep apnea given his larger next size and moderately crowded appearing airway. He does report snoring. He is obese. He is encouraged to think about pursuing sleep study testing and also encouraged to talk to his DOT physician about this. He is encouraged to find a new primary care physician as well. I suggested gradual increase of the Mysoline to add 1 pill at night for 2 weeks and then try to increase to 2 pills 3 times a day afterwards. He's currently taking 2 pills in the morning and 2 at lunchtime. I adjusted his prescription in that regard. He did not need a prescription for propranolol refill. I counseled him on smoking cessation.  He is encouraged to reduce his caffeine intake as this may help reduce his tremor on a day-to-day basis as well. Furthermore, I suggested consultation at [facility] for potential candidacy for deep brain stimulation for advancing essential tremor. He was in agreement. I suggested a 3 month follow-up with one of our nurse practitioners. I answered all his questions today and he was in agreement. Thank you very much for allowing me to participate in the care of this nice patient. If I can be of any further assistance to you please do not hesitate to call me at [phone].  Sincerely,   [place], MD, PhD

## 2017-01-04 ENCOUNTER — Ambulatory Visit: Payer: BLUE CROSS/BLUE SHIELD | Admitting: Neurology

## 2017-01-05 ENCOUNTER — Ambulatory Visit: Payer: BLUE CROSS/BLUE SHIELD | Admitting: Neurology

## 2017-01-20 DIAGNOSIS — L409 Psoriasis, unspecified: Secondary | ICD-10-CM | POA: Insufficient documentation

## 2017-01-20 DIAGNOSIS — M549 Dorsalgia, unspecified: Secondary | ICD-10-CM | POA: Insufficient documentation

## 2017-01-22 ENCOUNTER — Encounter (HOSPITAL_COMMUNITY): Payer: Self-pay | Admitting: Emergency Medicine

## 2017-01-22 ENCOUNTER — Emergency Department (HOSPITAL_COMMUNITY)
Admission: EM | Admit: 2017-01-22 | Discharge: 2017-01-22 | Disposition: A | Discharge status: Home / Self Care | Payer: BLUE CROSS/BLUE SHIELD | Attending: Emergency Medicine | Admitting: Emergency Medicine

## 2017-01-22 ENCOUNTER — Emergency Department (HOSPITAL_COMMUNITY): Payer: BLUE CROSS/BLUE SHIELD

## 2017-01-22 DIAGNOSIS — J40 Bronchitis, not specified as acute or chronic: Secondary | ICD-10-CM

## 2017-01-22 DIAGNOSIS — F17210 Nicotine dependence, cigarettes, uncomplicated: Secondary | ICD-10-CM | POA: Insufficient documentation

## 2017-01-22 DIAGNOSIS — Z79899 Other long term (current) drug therapy: Secondary | ICD-10-CM | POA: Diagnosis not present

## 2017-01-22 DIAGNOSIS — R05 Cough: Secondary | ICD-10-CM | POA: Diagnosis present

## 2017-01-22 DIAGNOSIS — J3489 Other specified disorders of nose and nasal sinuses: Secondary | ICD-10-CM | POA: Diagnosis not present

## 2017-01-22 DIAGNOSIS — R0602 Shortness of breath: Secondary | ICD-10-CM | POA: Diagnosis not present

## 2017-01-22 DIAGNOSIS — Z880 Allergy status to penicillin: Secondary | ICD-10-CM | POA: Insufficient documentation

## 2017-01-22 MED ORDER — AZITHROMYCIN 250 MG PO TABS: 250.0000 mg | ORAL_TABLET | Freq: Every day | ORAL | 0 refills | Status: DC

## 2017-01-22 MED ORDER — ALBUTEROL SULFATE HFA 108 (90 BASE) MCG/ACT IN AERS: 1.0000 | INHALATION_SPRAY | Freq: Four times a day (QID) | RESPIRATORY_TRACT | 0 refills | Status: DC | PRN

## 2017-01-22 MED ORDER — PREDNISONE 10 MG PO TABS: ORAL_TABLET | ORAL | 0 refills | Status: DC

## 2017-01-22 NOTE — ED Triage Notes (Signed)
Pt reports intermittent fever and sore throat since Friday. Highest fever was 101 at home. Denies OTC medications.

## 2017-01-22 NOTE — Discharge Instructions (Signed)
Return if any problems.  See your Physician for recheck  °

## 2017-01-22 NOTE — ED Provider Notes (Signed)
[facility] EMERGENCY DEPARTMENT Provider Note   CSN: [id number] Arrival date & time: 01/22/17  1224     History   Chief Complaint Chief Complaint  Patient presents with  . Sore Throat    HPI [place] is a 46 y.o. male.  The history is provided by the patient. No language interpreter was used.  Cough  This is a new problem. The current episode started more than 2 days ago. The problem occurs constantly. The problem has been gradually worsening. The cough is productive of sputum. The maximum temperature recorded prior to his arrival was 101 to 101.9 F. Associated symptoms include rhinorrhea and shortness of breath. He has tried nothing for the symptoms. The treatment provided no relief. He is a smoker. His past medical history does not include pneumonia.    Past Medical History:  Diagnosis Date  . Pneumonia   . Psoriasis     There are no active problems to display for this patient.   Past Surgical History:  Procedure Laterality Date  . KNEE SURGERY Left        Home Medications    Prior to Admission medications   Medication Sig Start Date End Date Taking? Authorizing Provider  Cyanocobalamin (B-12 PO) Take 2 tablets by mouth daily after breakfast.    [provider]  HUMIRA PEN 40 MG/0.8ML PNKT Inject 40 mg into the skin every 14 (fourteen) days.  08/20/16   [provider]  ibuprofen (ADVIL,MOTRIN) 600 MG tablet Take 1 tablet (600 mg total) by mouth every 6 (six) hours as needed for moderate pain. 09/14/16   Julianne Rice, MD  primidone (MYSOLINE) 50 MG tablet Take 2 pills in AM, 2 at lunch and 1 at bedtime for 2 weeks, then 2 pills 3 times a day thereafter. 12/21/16   Star Age, MD  propranolol (INDERAL) 40 MG tablet Take 80 mg by mouth 2 (two) times daily. MORNING and EVENING 08/12/16   [provider]    Family History History reviewed. No pertinent family history.  Social History Social History   Tobacco Use  . Smoking  status: Current Every Day Smoker    Packs/day: 1.00    Types: Cigarettes  . Smokeless tobacco: Never Used  Substance Use Topics  . Alcohol use: No  . Drug use: No     Allergies   Penicillins   Review of Systems Review of Systems  HENT: Positive for rhinorrhea.   Respiratory: Positive for cough and shortness of breath.   All other systems reviewed and are negative.    Physical Exam Updated Vital Signs BP 102/73 (BP Location: Left Arm)   Pulse 77   Resp 20   SpO2 94%   Physical Exam  Constitutional: He appears well-developed and well-nourished.  HENT:  Head: Normocephalic and atraumatic.  Right Ear: Tympanic membrane normal.  Left Ear: Tympanic membrane normal.  Mouth/Throat: Oropharynx is clear and moist and mucous membranes are normal.  Eyes: Conjunctivae are normal.  Neck: Neck supple.  Cardiovascular: Normal rate and regular rhythm.  No murmur heard. Pulmonary/Chest: Effort normal and breath sounds normal. No respiratory distress.  Abdominal: Soft. There is no tenderness.  Musculoskeletal: He exhibits no edema.  Neurological: He is alert.  Skin: Skin is warm and dry.  Psychiatric: He has a normal mood and affect.  Nursing note and vitals reviewed.    ED Treatments / Results  Labs (all labs ordered are listed, but only abnormal results are displayed) Labs Reviewed - No data  to display  EKG  EKG Interpretation None       Radiology Dg Chest 2 View  Result Date: 01/22/2017 CLINICAL DATA:  Cough, chest congestion, chest pain, and fever for several days. EXAM: CHEST  2 VIEW COMPARISON:  09/13/2016 FINDINGS: The heart size and mediastinal contours are within normal limits. Both lungs are clear. Previous seen lingular infiltrate has resolved since prior exam. The visualized skeletal structures are unremarkable. IMPRESSION: No active cardiopulmonary disease. Electronically Signed   By: Earle Gell M.D.   On: 01/22/2017 13:31    Procedures Procedures  (including critical care time)  Medications Ordered in ED Medications - No data to display   Initial Impression / Assessment and Plan / ED Course  I have reviewed the triage vital signs and the nursing notes.  Pertinent labs & imaging results that were available during my care of the patient were reviewed by me and considered in my medical decision making (see chart for details).       Final Clinical Impressions(s) / ED Diagnoses   Final diagnoses:  Bronchitis    ED Discharge Orders    None     Meds ordered this encounter  Medications  . azithromycin (ZITHROMAX) 250 MG tablet    Sig: Take 1 tablet (250 mg total) by mouth daily. Take first 2 tablets together, then 1 every day until finished.    Dispense:  6 tablet    Refill:  0    Order Specific Question:   Supervising Provider    Answer:   Lajean Saver [[id number]]  . albuterol (PROVENTIL HFA;VENTOLIN HFA) 108 (90 Base) MCG/ACT inhaler    Sig: Inhale 1-2 puffs into the lungs every 6 (six) hours as needed for wheezing or shortness of breath.    Dispense:  1 Inhaler    Refill:  0    Order Specific Question:   Supervising Provider    Answer:   Lajean Saver [[id number]]  . predniSONE (DELTASONE) 10 MG tablet    Sig: 6,5,4,3,2,1 taper    Dispense:  21 tablet    Refill:  0    Order Specific Question:   Supervising Provider    Answer:   Lajean Saver [[id number]]  An After Visit Summary was printed and given to the patient.    Fransico Meadow, [place] 01/22/17 1439    Lajean Saver, MD 01/27/17 1310

## 2017-02-06 ENCOUNTER — Emergency Department (HOSPITAL_COMMUNITY)
Admission: EM | Admit: 2017-02-06 | Discharge: 2017-02-06 | Disposition: A | Discharge status: Home / Self Care | Payer: BLUE CROSS/BLUE SHIELD

## 2017-02-06 NOTE — ED Triage Notes (Signed)
Pt has been called for triage x 2. No answer x 2. Pt assumed to have left prior to being seen.

## 2017-03-17 ENCOUNTER — Other Ambulatory Visit (HOSPITAL_COMMUNITY): Payer: Self-pay | Admitting: Respiratory Therapy

## 2017-03-17 DIAGNOSIS — J209 Acute bronchitis, unspecified: Secondary | ICD-10-CM

## 2017-03-17 DIAGNOSIS — R0602 Shortness of breath: Secondary | ICD-10-CM

## 2017-03-30 ENCOUNTER — Ambulatory Visit (HOSPITAL_COMMUNITY): Admission: RE | Admit: 2017-03-30 | Payer: BLUE CROSS/BLUE SHIELD | Source: Ambulatory Visit

## 2017-04-06 ENCOUNTER — Ambulatory Visit: Payer: BLUE CROSS/BLUE SHIELD | Admitting: Adult Health

## 2017-06-19 DIAGNOSIS — R112 Nausea with vomiting, unspecified: Secondary | ICD-10-CM

## 2017-06-19 DIAGNOSIS — Z9889 Other specified postprocedural states: Secondary | ICD-10-CM

## 2017-06-19 HISTORY — DX: Other specified postprocedural states: Z98.890

## 2017-06-19 HISTORY — DX: Other specified postprocedural states: R11.2

## 2017-07-25 ENCOUNTER — Ambulatory Visit: Payer: BLUE CROSS/BLUE SHIELD | Admitting: Adult Health

## 2017-08-08 DIAGNOSIS — Z9689 Presence of other specified functional implants: Secondary | ICD-10-CM | POA: Insufficient documentation

## 2018-02-21 DIAGNOSIS — U071 COVID-19: Secondary | ICD-10-CM

## 2018-02-21 HISTORY — DX: COVID-19: U07.1

## 2018-03-27 DIAGNOSIS — G4733 Obstructive sleep apnea (adult) (pediatric): Secondary | ICD-10-CM | POA: Insufficient documentation

## 2019-06-11 ENCOUNTER — Emergency Department (HOSPITAL_COMMUNITY)
Admission: EM | Admit: 2019-06-11 | Discharge: 2019-06-11 | Disposition: A | Discharge status: Home / Self Care | Payer: Worker's Compensation | Attending: Emergency Medicine | Admitting: Emergency Medicine

## 2019-06-11 ENCOUNTER — Other Ambulatory Visit: Payer: Self-pay

## 2019-06-11 ENCOUNTER — Encounter (HOSPITAL_COMMUNITY): Payer: Self-pay

## 2019-06-11 ENCOUNTER — Emergency Department (HOSPITAL_COMMUNITY): Payer: Worker's Compensation

## 2019-06-11 DIAGNOSIS — Y998 Other external cause status: Secondary | ICD-10-CM | POA: Insufficient documentation

## 2019-06-11 DIAGNOSIS — Z79899 Other long term (current) drug therapy: Secondary | ICD-10-CM | POA: Insufficient documentation

## 2019-06-11 DIAGNOSIS — Y92812 Truck as the place of occurrence of the external cause: Secondary | ICD-10-CM | POA: Diagnosis not present

## 2019-06-11 DIAGNOSIS — W010XXA Fall on same level from slipping, tripping and stumbling without subsequent striking against object, initial encounter: Secondary | ICD-10-CM | POA: Diagnosis not present

## 2019-06-11 DIAGNOSIS — F17210 Nicotine dependence, cigarettes, uncomplicated: Secondary | ICD-10-CM | POA: Insufficient documentation

## 2019-06-11 DIAGNOSIS — S4991XA Unspecified injury of right shoulder and upper arm, initial encounter: Secondary | ICD-10-CM | POA: Insufficient documentation

## 2019-06-11 DIAGNOSIS — Y9389 Activity, other specified: Secondary | ICD-10-CM | POA: Diagnosis not present

## 2019-06-11 HISTORY — DX: Tremor, unspecified: R25.1

## 2019-06-11 MED ORDER — IBUPROFEN 800 MG PO TABS
800.0000 mg | ORAL_TABLET | Freq: Once | ORAL | Status: AC
  Administered 2019-06-11: 800 mg via ORAL
  Filled 2019-06-11: qty 1

## 2019-06-11 MED ORDER — TRAMADOL HCL 50 MG PO TABS: 50.0000 mg | ORAL_TABLET | Freq: Four times a day (QID) | ORAL | 0 refills | Status: DC | PRN

## 2019-06-11 MED ORDER — IBUPROFEN 600 MG PO TABS: 600.0000 mg | ORAL_TABLET | Freq: Three times a day (TID) | ORAL | 0 refills | Status: DC

## 2019-06-11 NOTE — Discharge Instructions (Signed)
Apply ice packs on and off to your shoulder.  Call Dr. Ruthe Mannan office in a few days to arrange a follow-up appointment if your shoulder is not improving.

## 2019-06-11 NOTE — ED Provider Notes (Signed)
[facility] EMERGENCY DEPARTMENT Provider Note   CSN: [id number] Arrival date & time: 06/11/19  0725     History Chief Complaint  Patient presents with  . Fall    [place] is a 49 y.o. male.  HPI      [place] is a 49 y.o. male who presents to the Emergency Department complaining of right shoulder pain secondary to a mechanical fall that occurred this morning.  This is a work-related injury.  He states that he was pulling on the pan of a truck when the pin slipped and he fell landing onto his right shoulder.  He complains of throbbing pain to the shoulder joint that radiates to the right upper arm.  Pain does not extend into the elbow or wrist.  Pain is associated with movement of the shoulder and improves with the arm held in a flexed position to his chest.  He denies head injury, LOC, numbness or weakness of his extremities, chest pain or shortness of breath.  He denies history of previous shoulder injuries.  No treatments prior to arrival.   Past Medical History:  Diagnosis Date  . Pneumonia   . Psoriasis   . Tremors of nervous system     There are no problems to display for this patient.   Past Surgical History:  Procedure Laterality Date  . BURR HOLE W/ STEREOTACTIC INSERTION OF DBS LEADS / INTRAOP MICROELECTRODE RECORDING    . KNEE SURGERY Left        No family history on file.  Social History   Tobacco Use  . Smoking status: Current Every Day Smoker    Packs/day: 1.00    Types: Cigarettes  . Smokeless tobacco: Never Used  Substance Use Topics  . Alcohol use: No  . Drug use: No    Home Medications Prior to Admission medications   Medication Sig Start Date End Date Taking? Authorizing Provider  albuterol (PROVENTIL HFA;VENTOLIN HFA) 108 (90 Base) MCG/ACT inhaler Inhale 1-2 puffs into the lungs every 6 (six) hours as needed for wheezing or shortness of breath. 01/22/17   Fransico Meadow, PA-C  azithromycin (ZITHROMAX) 250 MG tablet Take 1  tablet (250 mg total) by mouth daily. Take first 2 tablets together, then 1 every day until finished. 01/22/17   Fransico Meadow, PA-C  Cyanocobalamin (B-12 PO) Take 2 tablets by mouth daily after breakfast.    [provider]  HUMIRA PEN 40 MG/0.8ML PNKT Inject 40 mg into the skin every 14 (fourteen) days.  08/20/16   [provider]  ibuprofen (ADVIL) 600 MG tablet Take 1 tablet (600 mg total) by mouth 3 (three) times daily. Take with food 06/11/19   Dian Minahan, PA-C  predniSONE (DELTASONE) 10 MG tablet 6,5,4,3,2,1 taper 01/22/17   Fransico Meadow, PA-C  primidone (MYSOLINE) 50 MG tablet Take 2 pills in AM, 2 at lunch and 1 at bedtime for 2 weeks, then 2 pills 3 times a day thereafter. 12/21/16   Star Age, MD  propranolol (INDERAL) 40 MG tablet Take 80 mg by mouth 2 (two) times daily. MORNING and EVENING 08/12/16   [provider]  traMADol (ULTRAM) 50 MG tablet Take 1 tablet (50 mg total) by mouth every 6 (six) hours as needed. 06/11/19   Edlin Ford, PA-C    Allergies    Penicillins  Review of Systems   Review of Systems  Constitutional: Negative for chills and fever.  Respiratory: Negative for shortness of breath.   Cardiovascular:  Negative for chest pain.  Musculoskeletal: Positive for arthralgias (Right shoulder pain). Negative for back pain, joint swelling and neck pain.  Skin: Negative for color change and wound.  Neurological: Negative for weakness and numbness.    Physical Exam Updated Vital Signs BP (!) 134/98 (BP Location: Left Arm)   Pulse 74   Temp 98.2 F (36.8 C) (Oral)   Resp 18   Ht 5\' 7"  (1.702 m)   Wt 99.8 kg   SpO2 98%   BMI 34.46 kg/m   Physical Exam Vitals and nursing note reviewed.  Constitutional:      General: He is not in acute distress.    Appearance: Normal appearance. He is not ill-appearing.  HENT:     Head: Atraumatic.  Cardiovascular:     Rate and Rhythm: Normal rate and regular rhythm.     Pulses: Normal  pulses.  Pulmonary:     Effort: Pulmonary effort is normal.     Breath sounds: Normal breath sounds.  Chest:     Chest wall: No tenderness.  Musculoskeletal:        General: Tenderness and signs of injury present. No swelling or deformity.     Right shoulder: Tenderness present. No swelling, bony tenderness or crepitus. Decreased range of motion. Normal strength. Normal pulse.     Cervical back: Normal range of motion. No tenderness.     Comments: Diffuse tenderness to palpation of the anterior right shoulder.  Pain with abduction of the shoulder.  No bony step-offs.  No edema.  Right wrist and elbow are nontender.  Compartments of the extremity are soft.  Skin:    General: Skin is warm.     Capillary Refill: Capillary refill takes less than 2 seconds.     Findings: No bruising, erythema or rash.  Neurological:     General: No focal deficit present.     Mental Status: He is alert.     ED Results / Procedures / Treatments   Labs (all labs ordered are listed, but only abnormal results are displayed) Labs Reviewed - No data to display  EKG None  Radiology DG Shoulder Right  Result Date: 06/11/2019 CLINICAL DATA:  Right shoulder pain due to an injury suffered in a fall this morning. Initial encounter. EXAM: RIGHT SHOULDER - 2+ VIEW COMPARISON:  None. FINDINGS: There is no evidence of fracture or dislocation. Mild acromioclavicular osteoarthritis is noted. Soft tissues are unremarkable. IMPRESSION: No acute abnormality. Mild acromioclavicular osteoarthritis. Electronically Signed   By: Inge Rise M.D.   On: 06/11/2019 08:55    Procedures Procedures (including critical care time)  Medications Ordered in ED Medications  ibuprofen (ADVIL) tablet 800 mg (800 mg Oral Given 06/11/19 [id number])    ED Course  I have reviewed the triage vital signs and the nursing notes.  Pertinent labs & imaging results that were available during my care of the patient were reviewed by me and  considered in my medical decision making (see chart for details).    MDM Rules/Calculators/A&P                        Patient with likely musculoskeletal injury of the right shoulder.  X-ray negative for fracture or dislocation.  Remains neurovascularly intact.  He agrees to symptomatic treatment and close orthopedic follow-up.   Final Clinical Impression(s) / ED Diagnoses Final diagnoses:  Injury of right shoulder, initial encounter    Rx / DC Orders ED Discharge Orders  Ordered    ibuprofen (ADVIL) 600 MG tablet  3 times daily     06/11/19 0917    traMADol (ULTRAM) 50 MG tablet  Every 6 hours PRN     06/11/19 Ecru, Ezri Fanguy, PA-C 06/11/19 0932    Virgel Manifold, MD 06/12/19 [phone]

## 2019-06-11 NOTE — ED Triage Notes (Signed)
Pt reports was trying to pull a pin out of truck and fell onto r shoulder.

## 2019-06-19 ENCOUNTER — Telehealth: Payer: Self-pay | Admitting: Orthopedic Surgery

## 2019-06-19 NOTE — Telephone Encounter (Signed)
Call received from patient inquiring about scheduling appointment for work-related injury for which he was seen at [facility] Emergency room on 06/11/19 for shoulder problem. Discussed worker's comp protocol. States "think it has been approved" - will have employer or worker's comp insurer contact us directly. Patient also said he was written out of work by emergency room doctor for 1 week. Today, 06/19/19, is the first day patient has called to discuss appointment. Voiced understanding of process; pending further information from worker's comp.

## 2019-06-27 NOTE — Telephone Encounter (Signed)
No further response. 

## 2019-07-18 ENCOUNTER — Encounter: Payer: Self-pay | Admitting: Emergency Medicine

## 2019-07-18 ENCOUNTER — Emergency Department: Payer: No Typology Code available for payment source

## 2019-07-18 ENCOUNTER — Other Ambulatory Visit: Payer: Self-pay

## 2019-07-18 ENCOUNTER — Emergency Department
Admission: EM | Admit: 2019-07-18 | Discharge: 2019-07-18 | Disposition: A | Discharge status: Home / Self Care | Payer: No Typology Code available for payment source | Attending: Emergency Medicine | Admitting: Emergency Medicine

## 2019-07-18 DIAGNOSIS — Z5321 Procedure and treatment not carried out due to patient leaving prior to being seen by health care provider: Secondary | ICD-10-CM | POA: Insufficient documentation

## 2019-07-18 DIAGNOSIS — R0789 Other chest pain: Secondary | ICD-10-CM | POA: Diagnosis present

## 2019-07-18 LAB — BASIC METABOLIC PANEL
Anion gap: 7 (ref 5–15)
BUN: 20 mg/dL (ref 6–20)
CO2: 27 mmol/L (ref 22–32)
Calcium: 9 mg/dL (ref 8.9–10.3)
Chloride: 106 mmol/L (ref 98–111)
Creatinine, Ser: 0.88 mg/dL (ref 0.61–1.24)
GFR calc Af Amer: >60 mL/min (ref >60)
GFR calc non Af Amer: >60 mL/min (ref >60)
Glucose, Bld: 102 mg/dL — ABNORMAL HIGH (ref 70–99)
Potassium: 3.9 mmol/L (ref 3.5–5.1)
Sodium: 140 mmol/L (ref 135–145)

## 2019-07-18 LAB — CBC
HCT: 44 % (ref 39.0–52.0)
Hemoglobin: 15 g/dL (ref 13.0–17.0)
MCH: 30.9 pg (ref 26.0–34.0)
MCHC: 34.1 g/dL (ref 30.0–36.0)
MCV: 90.5 fL (ref 80.0–100.0)
Platelets: 134 10*3/uL — ABNORMAL LOW (ref 150–400)
RBC: 4.86 MIL/uL (ref 4.22–5.81)
RDW: 12.3 % (ref 11.5–15.5)
WBC: 5 10*3/uL (ref 4.0–10.5)
nRBC: 0 % (ref 0.0–0.2)

## 2019-07-18 LAB — TROPONIN I (HIGH SENSITIVITY): Troponin I (High Sensitivity): 3 ng/L (ref <18)

## 2019-07-18 NOTE — ED Triage Notes (Signed)
Patient to ER for c/o midsternal and left chest pain since this am. Patient reports pain is now radiating down left arm. Denies any cardiac history, father died at age 51 of heart attack. Patient denies N/V, diaphoresis, or shortness of breath.

## 2019-07-18 NOTE — ED Notes (Signed)
Pt called for VS recheck with no response at this time,.

## 2019-07-18 NOTE — ED Notes (Signed)
Pt called with no response in the WR.  

## 2019-07-23 ENCOUNTER — Telehealth: Payer: Self-pay | Admitting: Emergency Medicine

## 2019-07-23 NOTE — Telephone Encounter (Signed)
Called patient due to lwot to inquire about condition and follow up plans. Left message.   

## 2019-10-07 ENCOUNTER — Other Ambulatory Visit: Payer: Self-pay

## 2019-10-07 ENCOUNTER — Ambulatory Visit (HOSPITAL_COMMUNITY)
Admission: RE | Admit: 2019-10-07 | Discharge: 2019-10-07 | Disposition: A | Discharge status: Home / Self Care | Payer: Worker's Compensation | Source: Ambulatory Visit | Attending: Family Medicine | Admitting: Family Medicine

## 2019-10-07 ENCOUNTER — Other Ambulatory Visit (HOSPITAL_COMMUNITY): Payer: Self-pay | Admitting: Family Medicine

## 2019-10-07 DIAGNOSIS — J449 Chronic obstructive pulmonary disease, unspecified: Secondary | ICD-10-CM | POA: Insufficient documentation

## 2019-10-07 DIAGNOSIS — Z01818 Encounter for other preprocedural examination: Secondary | ICD-10-CM

## 2019-10-08 ENCOUNTER — Other Ambulatory Visit: Payer: Self-pay | Admitting: Orthopedic Surgery

## 2019-10-10 ENCOUNTER — Other Ambulatory Visit: Payer: Self-pay

## 2019-10-10 ENCOUNTER — Encounter
Admission: RE | Admit: 2019-10-10 | Discharge: 2019-10-10 | Disposition: A | Discharge status: Home / Self Care | Payer: No Typology Code available for payment source | Source: Ambulatory Visit | Attending: Orthopedic Surgery | Admitting: Orthopedic Surgery

## 2019-10-10 DIAGNOSIS — Z01812 Encounter for preprocedural laboratory examination: Secondary | ICD-10-CM | POA: Insufficient documentation

## 2019-10-10 DIAGNOSIS — Z01810 Encounter for preprocedural cardiovascular examination: Secondary | ICD-10-CM | POA: Diagnosis present

## 2019-10-10 DIAGNOSIS — I10 Essential (primary) hypertension: Secondary | ICD-10-CM | POA: Insufficient documentation

## 2019-10-10 DIAGNOSIS — J449 Chronic obstructive pulmonary disease, unspecified: Secondary | ICD-10-CM | POA: Diagnosis not present

## 2019-10-10 HISTORY — DX: Chronic obstructive pulmonary disease, unspecified: J44.9

## 2019-10-10 HISTORY — DX: Essential (primary) hypertension: I10

## 2019-10-10 HISTORY — DX: Sleep apnea, unspecified: G47.30

## 2019-10-10 HISTORY — DX: Bilateral inguinal hernia, without obstruction or gangrene, not specified as recurrent: K40.20

## 2019-10-10 HISTORY — DX: Gastro-esophageal reflux disease without esophagitis: K21.9

## 2019-10-10 NOTE — Patient Instructions (Addendum)
Your procedure is scheduled on: 10-17-19 THURSDAY Report to Same Day Surgery 2nd floor medical mall [facility] Entrance-take elevator on left to 2nd floor.  Check in with surgery information desk.) To find out your arrival time please call [phone] between 1PM - 3PM on 10-16-19 [facility]  Remember: Instructions that are not followed completely may result in serious medical risk, up to and including death, or upon the discretion of your surgeon and anesthesiologist your surgery may need to be rescheduled.    _x___ 1. Do not eat food after midnight the night before your procedure. NO GUM OR CANDY AFTER MIDNIGHT. You may drink clear liquids up to 2 hours before you are scheduled to arrive at the hospital for your procedure.  Do not drink clear liquids within 2 hours of your scheduled arrival to the hospital.  Clear liquids include  --Water or Apple juice without pulp  -- Gatorade  --Black Coffee or Clear Tea (No milk, no creamers, do not add anything to the coffee or Tea-Sugar is ok to add)    __x__ 2. No Alcohol for 24 hours before or after surgery.   __x__3. No Smoking or e-cigarettes for 24 prior to surgery.  Do not use any chewable tobacco products for at least 6 hour prior to surgery   ____  4. Bring all medications with you on the day of surgery if instructed.    __x__ 5. Notify your doctor if there is any change in your medical condition     (cold, fever, infections).    x___6. On the morning of surgery brush your teeth with toothpaste and water.  You may rinse your mouth with mouth wash if you wish.  Do not swallow any toothpaste or mouthwash.   Do not wear jewelry, make-up, hairpins, clips or nail polish.  Do not wear lotions, powders, or perfumes.  Do not shave 48 hours prior to surgery. Men may shave face and neck.  Do not bring valuables to the hospital.    [facility] is not responsible for any belongings or valuables.               Contacts, dentures or bridgework  may not be worn into surgery.  Leave your suitcase in the car. After surgery it may be brought to your room.  For patients admitted to the hospital, discharge time is determined by your treatment team.  _  Patients discharged the day of surgery will not be allowed to drive home.  You will need someone to drive you home and stay with you the night of your procedure.    Please read over the following fact sheets that you were given:   [facility] Preparing for Surgery  ____ TAKE THE FOLLOWING MEDICATION THE MORNING OF SURGERY WITH A SMALL SIP OF WATER. These include:  1. NONE  2.  3.  4.  5.  6.  ____Fleets enema or Magnesium Citrate as directed.   _x___ Use CHG Soap as directed on instruction sheet   _X___ Use inhalers on the day of surgery and bring to hospital day of surgery-USE YOUR ALBUTEROL INHALER THE MORNING OF SURGERY AND [place]  ____ Stop Metformin and Janumet 2 days prior to surgery.    ____ Take 1/2 of usual insulin dose the night before surgery and none on the morning surgery.   ____ Follow recommendations from Cardiologist, Pulmonologist or PCP regarding stopping Aspirin, Coumadin, Plavix ,Eliquis, Effient, or Pradaxa, and Pletal.  X____Stop Anti-inflammatories  such as Advil, Aleve, Ibuprofen, Motrin, Naproxen, MOBIC (MELOXICAM) Naprosyn, Goodies powders or aspirin products NOW-OK to take Tylenol    ____ Stop supplements until after surgery.   _X___ Bring C-Pap to the hospital.

## 2019-10-11 ENCOUNTER — Encounter
Admission: RE | Admit: 2019-10-11 | Discharge: 2019-10-11 | Disposition: A | Discharge status: Home / Self Care | Payer: No Typology Code available for payment source | Source: Ambulatory Visit | Attending: Orthopedic Surgery | Admitting: Orthopedic Surgery

## 2019-10-11 DIAGNOSIS — Z01818 Encounter for other preprocedural examination: Secondary | ICD-10-CM | POA: Diagnosis present

## 2019-10-11 LAB — APTT: aPTT: 31 seconds (ref 24–36)

## 2019-10-11 LAB — PROTIME-INR
INR: 1 (ref 0.8–1.2)
Prothrombin Time: 12.9 seconds (ref 11.4–15.2)

## 2019-10-14 NOTE — Progress Notes (Signed)
  [facility] Perioperative Services: Pre-Admission/Anesthesia Testing     Date: 10/14/19  Name: Alexander Kennedy MRN:   [id number]  Re: Pre-operative ABX  Patient scheduled for surgery with coming up on 10/17/2019. In review of his orders it was noted that he had vancomycin ordered for on call to the OR. Patient has a PCN allergy listed. In review of his EMR, I noted that patient successfully tolerated a first generation cephalosporin (cefazolin) when he had his DBS placed in 2019 at [facility]. Primary attending surgeon Mack Guise, MD) contacted to inquire about the potential of a possible change in the pre-operative antimicrobial agent being used in this case. Request for change in antimicrobial agent  being made in efforts to provide more directed approach and prevent the development of MDROs (i.e VRE). Per Dr. Mack Guise, it is acceptable to discontinue the vancomycin as ordered and place order for 2 gm cefazolin. These actions were carried out following collaborative conversation with MD taking into consideration the risks versus benefits of the aforementioned therapeutic change.   Honor Loh, MSN, APRN, FNP-C, CEN [facility]  Peri-operative Services Nurse Practitioner Phone: [phone] 10/14/19 4:33 PM

## 2019-10-15 ENCOUNTER — Other Ambulatory Visit
Admission: RE | Admit: 2019-10-15 | Discharge: 2019-10-15 | Disposition: A | Discharge status: Home / Self Care | Payer: No Typology Code available for payment source | Source: Ambulatory Visit | Attending: Orthopedic Surgery | Admitting: Orthopedic Surgery

## 2019-10-15 ENCOUNTER — Other Ambulatory Visit: Payer: Self-pay

## 2019-10-15 DIAGNOSIS — Z01812 Encounter for preprocedural laboratory examination: Secondary | ICD-10-CM | POA: Insufficient documentation

## 2019-10-15 DIAGNOSIS — Z20822 Contact with and (suspected) exposure to covid-19: Secondary | ICD-10-CM | POA: Insufficient documentation

## 2019-10-15 NOTE — Progress Notes (Signed)
  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 Communicated with Dr. Eldridge Abrahams [facility]) late yesterday afternoon regarding patient's upcoming procedure and surgical team's request for clearance.  MD advising that clearance would need to come from patient's PCP. Of note, medical clearance has been issued with a LOW risk stratification. I specifically inquired about perioperative precautions and/or recommendations for the implanted device. Per Dr. Linus Mako, "patient cannot have diathermy. Before MRI, mammogram, electrocautery or elective heart defibrillation please call [place] [phone]) to make sure the procedure is safe".  Spoke to [employer] Lillia Carmel) to discuss patient's upcoming procedures and any precautions and/or considerations in regards to patient device during the perioperative period. The following information was provided.    In efforts to ensure and promote patient safety I will send a copy of this note outlining conversation with neurologist and device representative to orthopedic surgeon performing procedure. Additionally, note will be readily available to surgical and anesthesia team for day of surgery review.   Honor Loh, MSN, APRN, FNP-C, CEN [facility]  Peri-operative Services Nurse Practitioner Phone: [phone] 10/15/19 3:48 PM

## 2019-10-16 LAB — SARS CORONAVIRUS 2 (TAT 6-24 HRS): SARS Coronavirus 2: NEGATIVE

## 2019-10-16 NOTE — Progress Notes (Signed)
Today at 54, I Spoke with Dr Arlan Organ, neurosurgeon at [facility] who performed this patient's DBS implantation, regarding the use of electrocautery for tomorrow's shoulder surgery. He confirmed that electrocautery can cause interference and damage to the DBS leads, as well as lead to inadvertent shocks to the patient. He said he would see if his DBS coordinator could call us to arrange for a [employer] rep to be present during the perioperative period, but if this were unable to be arranged, a still viable alternative would be asking the patient himself to turn off his DBS at least 5 minutes before application of electrocautery.  Dr Arlan Organ said the DBS leads are going down the patient's left neck and clavicle, and thus out of the surgical field, and away from potential interscalene block site.

## 2019-10-17 ENCOUNTER — Ambulatory Visit: Payer: Worker's Compensation | Admitting: Anesthesiology

## 2019-10-17 ENCOUNTER — Ambulatory Visit: Payer: Worker's Compensation

## 2019-10-17 ENCOUNTER — Ambulatory Visit
Admission: RE | Admit: 2019-10-17 | Discharge: 2019-10-17 | Disposition: A | Discharge status: Home / Self Care | Payer: Worker's Compensation | Attending: Orthopedic Surgery | Admitting: Orthopedic Surgery

## 2019-10-17 ENCOUNTER — Encounter: Payer: Self-pay | Admitting: Orthopedic Surgery

## 2019-10-17 ENCOUNTER — Encounter
Admission: RE | Disposition: A | Discharge status: Home / Self Care | Payer: Self-pay | Source: Home / Self Care | Attending: Orthopedic Surgery

## 2019-10-17 DIAGNOSIS — G902 Horner's syndrome: Secondary | ICD-10-CM | POA: Insufficient documentation

## 2019-10-17 DIAGNOSIS — I10 Essential (primary) hypertension: Secondary | ICD-10-CM | POA: Insufficient documentation

## 2019-10-17 DIAGNOSIS — Z79899 Other long term (current) drug therapy: Secondary | ICD-10-CM | POA: Insufficient documentation

## 2019-10-17 DIAGNOSIS — S46111A Strain of muscle, fascia and tendon of long head of biceps, right arm, initial encounter: Secondary | ICD-10-CM | POA: Diagnosis not present

## 2019-10-17 DIAGNOSIS — G4730 Sleep apnea, unspecified: Secondary | ICD-10-CM | POA: Diagnosis not present

## 2019-10-17 DIAGNOSIS — Z87891 Personal history of nicotine dependence: Secondary | ICD-10-CM | POA: Diagnosis not present

## 2019-10-17 DIAGNOSIS — Z7951 Long term (current) use of inhaled steroids: Secondary | ICD-10-CM | POA: Insufficient documentation

## 2019-10-17 DIAGNOSIS — J449 Chronic obstructive pulmonary disease, unspecified: Secondary | ICD-10-CM | POA: Insufficient documentation

## 2019-10-17 DIAGNOSIS — X58XXXA Exposure to other specified factors, initial encounter: Secondary | ICD-10-CM | POA: Diagnosis not present

## 2019-10-17 DIAGNOSIS — M75121 Complete rotator cuff tear or rupture of right shoulder, not specified as traumatic: Secondary | ICD-10-CM | POA: Insufficient documentation

## 2019-10-17 DIAGNOSIS — M7551 Bursitis of right shoulder: Secondary | ICD-10-CM | POA: Diagnosis not present

## 2019-10-17 DIAGNOSIS — Z419 Encounter for procedure for purposes other than remedying health state, unspecified: Secondary | ICD-10-CM

## 2019-10-17 DIAGNOSIS — K219 Gastro-esophageal reflux disease without esophagitis: Secondary | ICD-10-CM | POA: Insufficient documentation

## 2019-10-17 HISTORY — PX: SHOULDER ARTHROSCOPY WITH OPEN ROTATOR CUFF REPAIR AND DISTAL CLAVICLE ACROMINECTOMY: SHX5683

## 2019-10-17 SURGERY — SHOULDER ARTHROSCOPY WITH OPEN ROTATOR CUFF REPAIR AND DISTAL CLAVICLE ACROMINECTOMY
Anesthesia: General | Laterality: Right

## 2019-10-17 MED ORDER — LACTATED RINGERS IV SOLN: INTRAVENOUS | Status: DC

## 2019-10-17 MED ORDER — FENTANYL CITRATE (PF) 100 MCG/2ML IJ SOLN: 25.0000 ug | INTRAMUSCULAR | Status: DC | PRN

## 2019-10-17 MED ORDER — ONDANSETRON HCL 4 MG PO TABS: 4.0000 mg | ORAL_TABLET | Freq: Three times a day (TID) | ORAL | 0 refills | Status: DC | PRN

## 2019-10-17 MED ORDER — LIDOCAINE HCL (CARDIAC) PF 100 MG/5ML IV SOSY
PREFILLED_SYRINGE | INTRAVENOUS | Status: DC | PRN
  Administered 2019-10-17: 100 mg via INTRAVENOUS

## 2019-10-17 MED ORDER — ROCURONIUM BROMIDE 10 MG/ML (PF) SYRINGE
PREFILLED_SYRINGE | INTRAVENOUS | Status: AC
  Filled 2019-10-17: qty 10

## 2019-10-17 MED ORDER — BUPIVACAINE LIPOSOME 1.3 % IJ SUSP
INTRAMUSCULAR | Status: AC
  Filled 2019-10-17: qty 20

## 2019-10-17 MED ORDER — ORAL CARE MOUTH RINSE: 15.0000 mL | Freq: Once | OROMUCOSAL | Status: AC

## 2019-10-17 MED ORDER — GLYCOPYRROLATE 0.2 MG/ML IJ SOLN
INTRAMUSCULAR | Status: DC | PRN
  Administered 2019-10-17: .2 mg via INTRAVENOUS

## 2019-10-17 MED ORDER — CHLORHEXIDINE GLUCONATE CLOTH 2 % EX PADS: 6.0000 | MEDICATED_PAD | Freq: Once | CUTANEOUS | Status: DC

## 2019-10-17 MED ORDER — MIDAZOLAM HCL 2 MG/2ML IJ SOLN
INTRAMUSCULAR | Status: AC
  Administered 2019-10-17: 2 mg
  Filled 2019-10-17: qty 2

## 2019-10-17 MED ORDER — OXYCODONE HCL 5 MG PO TABS: 5.0000 mg | ORAL_TABLET | Freq: Once | ORAL | Status: DC | PRN

## 2019-10-17 MED ORDER — PHENYLEPHRINE HCL-NACL 10-0.9 MG/250ML-% IV SOLN
INTRAVENOUS | Status: DC | PRN
  Administered 2019-10-17: 25 ug/min via INTRAVENOUS

## 2019-10-17 MED ORDER — FENTANYL CITRATE (PF) 100 MCG/2ML IJ SOLN: 50.0000 ug | INTRAMUSCULAR | Status: DC | PRN

## 2019-10-17 MED ORDER — PROPOFOL 10 MG/ML IV BOLUS
INTRAVENOUS | Status: DC | PRN
  Administered 2019-10-17: 150 mg via INTRAVENOUS

## 2019-10-17 MED ORDER — CHLORHEXIDINE GLUCONATE 0.12 % MT SOLN
OROMUCOSAL | Status: AC
  Filled 2019-10-17: qty 15

## 2019-10-17 MED ORDER — ACETAMINOPHEN 10 MG/ML IV SOLN
INTRAVENOUS | Status: AC
  Filled 2019-10-17: qty 100

## 2019-10-17 MED ORDER — ONDANSETRON HCL 4 MG/2ML IJ SOLN
INTRAMUSCULAR | Status: AC
  Filled 2019-10-17: qty 2

## 2019-10-17 MED ORDER — DEXAMETHASONE SODIUM PHOSPHATE 10 MG/ML IJ SOLN
INTRAMUSCULAR | Status: DC | PRN
  Administered 2019-10-17: 5 mg via INTRAVENOUS

## 2019-10-17 MED ORDER — BUPIVACAINE HCL (PF) 0.5 % IJ SOLN
INTRAMUSCULAR | Status: AC
  Filled 2019-10-17: qty 10

## 2019-10-17 MED ORDER — CHLORHEXIDINE GLUCONATE 0.12 % MT SOLN
15.0000 mL | Freq: Once | OROMUCOSAL | Status: AC
  Administered 2019-10-17: 15 mL via OROMUCOSAL

## 2019-10-17 MED ORDER — FENTANYL CITRATE (PF) 100 MCG/2ML IJ SOLN
INTRAMUSCULAR | Status: DC | PRN
Start: 2019-10-17 — End: 2019-10-17
  Administered 2019-10-17: 100 ug via INTRAVENOUS

## 2019-10-17 MED ORDER — OXYCODONE HCL 5 MG/5ML PO SOLN: 5.0000 mg | Freq: Once | ORAL | Status: DC | PRN

## 2019-10-17 MED ORDER — BUPIVACAINE HCL (PF) 0.5 % IJ SOLN
INTRAMUSCULAR | Status: DC | PRN
  Administered 2019-10-17: 10 mL

## 2019-10-17 MED ORDER — LIDOCAINE HCL (PF) 2 % IJ SOLN
INTRAMUSCULAR | Status: AC
  Filled 2019-10-17: qty 10

## 2019-10-17 MED ORDER — LIDOCAINE HCL (PF) 1 % IJ SOLN
INTRAMUSCULAR | Status: AC
  Filled 2019-10-17: qty 30

## 2019-10-17 MED ORDER — OXYCODONE HCL 5 MG PO TABS: 5.0000 mg | ORAL_TABLET | ORAL | 0 refills | Status: DC | PRN

## 2019-10-17 MED ORDER — EPINEPHRINE PF 1 MG/ML IJ SOLN
INTRAMUSCULAR | Status: DC | PRN
  Administered 2019-10-17: 4 mL

## 2019-10-17 MED ORDER — PHENYLEPHRINE HCL (PRESSORS) 10 MG/ML IV SOLN
INTRAVENOUS | Status: DC | PRN
  Administered 2019-10-17 (×2): 200 ug via INTRAVENOUS

## 2019-10-17 MED ORDER — EPHEDRINE 5 MG/ML INJ
INTRAVENOUS | Status: AC
  Filled 2019-10-17: qty 10

## 2019-10-17 MED ORDER — MIDAZOLAM HCL 2 MG/2ML IJ SOLN
INTRAMUSCULAR | Status: AC
  Filled 2019-10-17: qty 2

## 2019-10-17 MED ORDER — CEFAZOLIN SODIUM-DEXTROSE 2-4 GM/100ML-% IV SOLN
2.0000 g | Freq: Once | INTRAVENOUS | Status: AC
  Administered 2019-10-17: 2 g via INTRAVENOUS

## 2019-10-17 MED ORDER — MIDAZOLAM HCL 2 MG/2ML IJ SOLN: 1.0000 mg | INTRAMUSCULAR | Status: DC | PRN

## 2019-10-17 MED ORDER — CEFAZOLIN SODIUM-DEXTROSE 2-4 GM/100ML-% IV SOLN
INTRAVENOUS | Status: AC
  Filled 2019-10-17: qty 100

## 2019-10-17 MED ORDER — BUPIVACAINE LIPOSOME 1.3 % IJ SUSP
INTRAMUSCULAR | Status: DC | PRN
  Administered 2019-10-17: 10 mL

## 2019-10-17 MED ORDER — NEOMYCIN-POLYMYXIN B GU 40-200000 IR SOLN
Status: AC
  Filled 2019-10-17: qty 20

## 2019-10-17 MED ORDER — FENTANYL CITRATE (PF) 100 MCG/2ML IJ SOLN
INTRAMUSCULAR | Status: AC
  Administered 2019-10-17: 100 ug
  Filled 2019-10-17: qty 2

## 2019-10-17 MED ORDER — FENTANYL CITRATE (PF) 100 MCG/2ML IJ SOLN
INTRAMUSCULAR | Status: AC
  Filled 2019-10-17: qty 2

## 2019-10-17 MED ORDER — EPINEPHRINE PF 1 MG/ML IJ SOLN
INTRAMUSCULAR | Status: AC
  Filled 2019-10-17: qty 1

## 2019-10-17 MED ORDER — EPHEDRINE SULFATE 50 MG/ML IJ SOLN
INTRAMUSCULAR | Status: DC | PRN
  Administered 2019-10-17 (×3): 10 mg via INTRAVENOUS

## 2019-10-17 MED ORDER — SUGAMMADEX SODIUM 200 MG/2ML IV SOLN
INTRAVENOUS | Status: DC | PRN
  Administered 2019-10-17: 200 mg via INTRAVENOUS

## 2019-10-17 MED ORDER — ACETAMINOPHEN 10 MG/ML IV SOLN: 1000.0000 mg | Freq: Once | INTRAVENOUS | Status: DC | PRN

## 2019-10-17 MED ORDER — ROCURONIUM BROMIDE 100 MG/10ML IV SOLN
INTRAVENOUS | Status: DC | PRN
  Administered 2019-10-17: 20 mg via INTRAVENOUS
  Administered 2019-10-17: 60 mg via INTRAVENOUS

## 2019-10-17 MED ORDER — ONDANSETRON HCL 4 MG/2ML IJ SOLN: 4.0000 mg | Freq: Once | INTRAMUSCULAR | Status: DC | PRN

## 2019-10-17 MED ORDER — PROPOFOL 10 MG/ML IV BOLUS
INTRAVENOUS | Status: AC
  Filled 2019-10-17: qty 60

## 2019-10-17 MED ORDER — BUPIVACAINE HCL (PF) 0.25 % IJ SOLN
INTRAMUSCULAR | Status: AC
  Filled 2019-10-17: qty 30

## 2019-10-17 MED ORDER — GLYCOPYRROLATE 0.2 MG/ML IJ SOLN
INTRAMUSCULAR | Status: AC
  Filled 2019-10-17: qty 1

## 2019-10-17 MED ORDER — PHENYLEPHRINE HCL (PRESSORS) 10 MG/ML IV SOLN
INTRAVENOUS | Status: AC
  Filled 2019-10-17: qty 1

## 2019-10-17 MED ORDER — ONDANSETRON HCL 4 MG/2ML IJ SOLN
INTRAMUSCULAR | Status: DC | PRN
  Administered 2019-10-17: 4 mg via INTRAVENOUS

## 2019-10-17 MED ORDER — DEXAMETHASONE SODIUM PHOSPHATE 10 MG/ML IJ SOLN
INTRAMUSCULAR | Status: AC
  Filled 2019-10-17: qty 1

## 2019-10-17 MED ORDER — ACETAMINOPHEN 10 MG/ML IV SOLN
INTRAVENOUS | Status: DC | PRN
  Administered 2019-10-17: 1000 mg via INTRAVENOUS

## 2019-10-17 SURGICAL SUPPLY — 74 items
ADAPTER IRRIG TUBE 2 SPIKE SOL (ADAPTER) ×4 IMPLANT
ADPR TBG 2 SPK PMP STRL ASCP (ADAPTER) ×2
ANCH SUT 5.5 KNTLS PEEK (Orthopedic Implant) ×3 IMPLANT
ANCH SUT Q-FX 2.8 (Anchor) ×2 IMPLANT
ANCHOR ALL-SUT Q-FIX 2.8 (Anchor) ×4 IMPLANT
ANCHOR SUT 5.5 MULTIFIX (Orthopedic Implant) ×6 IMPLANT
BUR RADIUS 4.0X18.5 (BURR) ×2 IMPLANT
BUR RADIUS 5.5 (BURR) ×2 IMPLANT
CANISTER SUCT LVC 12 LTR MEDI- (MISCELLANEOUS) ×2 IMPLANT
CANNULA 5.75X7 CRYSTAL CLEAR (CANNULA) ×4 IMPLANT
CANNULA PARTIAL THREAD 2X7 (CANNULA) ×2 IMPLANT
CANNULA TWIST IN 8.25X9CM (CANNULA) ×4 IMPLANT
CONNECTOR PERFECT PASSER (CONNECTOR) ×6 IMPLANT
COOLER POLAR GLACIER W/PUMP (MISCELLANEOUS) ×2 IMPLANT
COVER WAND RF STERILE (DRAPES) ×2 IMPLANT
DEVICE SUCT BLK HOLE OR FLOOR (MISCELLANEOUS) ×4 IMPLANT
DRAPE 3/4 80X56 (DRAPES) ×2 IMPLANT
DRAPE IMP U-DRAPE 54X76 (DRAPES) ×4 IMPLANT
DRAPE INCISE IOBAN 66X45 STRL (DRAPES) ×2 IMPLANT
DRAPE U-SHAPE 47X51 STRL (DRAPES) ×2 IMPLANT
DURAPREP 26ML APPLICATOR (WOUND CARE) ×6 IMPLANT
ELECT REM PT RETURN 9FT ADLT (ELECTROSURGICAL) ×2
ELECTRODE REM PT RTRN 9FT ADLT (ELECTROSURGICAL) ×1 IMPLANT
GAUZE SPONGE 4X4 12PLY STRL (GAUZE/BANDAGES/DRESSINGS) ×2 IMPLANT
GAUZE XEROFORM 1X8 LF (GAUZE/BANDAGES/DRESSINGS) ×2 IMPLANT
GLOVE BIOGEL PI IND STRL 9 (GLOVE) ×1 IMPLANT
GLOVE BIOGEL PI INDICATOR 9 (GLOVE) ×1
GLOVE SURG 9.0 ORTHO LTXF (GLOVE) ×6 IMPLANT
GOWN STRL REUS TWL 2XL XL LVL4 (GOWN DISPOSABLE) ×2 IMPLANT
GOWN STRL REUS W/ TWL LRG LVL3 (GOWN DISPOSABLE) ×1 IMPLANT
GOWN STRL REUS W/TWL LRG LVL3 (GOWN DISPOSABLE) ×2
IV LACTATED RINGER IRRG 3000ML (IV SOLUTION) ×16
IV LR IRRIG 3000ML ARTHROMATIC (IV SOLUTION) ×8 IMPLANT
KIT STABILIZATION SHOULDER (MISCELLANEOUS) ×2 IMPLANT
KIT SUTURE 2.8 Q-FIX DISP (MISCELLANEOUS) ×4 IMPLANT
KIT SUTURETAK 3.0 INSERT PERC (KITS) IMPLANT
KIT TURNOVER KIT A (KITS) ×2 IMPLANT
MANIFOLD NEPTUNE II (INSTRUMENTS) ×2 IMPLANT
MASK FACE SPIDER DISP (MASK) ×2 IMPLANT
MAT ABSORB  FLUID 56X50 GRAY (MISCELLANEOUS) ×3
MAT ABSORB FLUID 56X50 GRAY (MISCELLANEOUS) ×3 IMPLANT
NDL SAFETY ECLIPSE 18X1.5 (NEEDLE) ×1 IMPLANT
NEEDLE HYPO 18GX1.5 SHARP (NEEDLE) ×2
NEEDLE HYPO 22GX1.5 SAFETY (NEEDLE) ×2 IMPLANT
NEEDLE SPNL 18GX3.5 QUINCKE PK (NEEDLE) ×2 IMPLANT
NS IRRIG 500ML POUR BTL (IV SOLUTION) ×2 IMPLANT
PACK SHDR ARTHRO (MISCELLANEOUS) ×2 IMPLANT
PAD ARMBOARD 7.5X6 YLW CONV (MISCELLANEOUS) ×4 IMPLANT
PAD WRAPON POLAR SHDR XLG (MISCELLANEOUS) ×1 IMPLANT
PASSER SUT CAPTURE FIRST (INSTRUMENTS) ×2 IMPLANT
PASSER SUT FIRSTPASS SELF (INSTRUMENTS) ×2 IMPLANT
SET TUBE SUCT SHAVER OUTFL 24K (TUBING) ×2 IMPLANT
SET TUBE TIP INTRA-ARTICULAR (MISCELLANEOUS) ×2 IMPLANT
STRIP CLOSURE SKIN 1/2X4 (GAUZE/BANDAGES/DRESSINGS) ×2 IMPLANT
SUT ETHILON 4-0 (SUTURE) ×2
SUT ETHILON 4-0 FS2 18XMFL BLK (SUTURE) ×1
SUT LASSO 90 DEG SD STR (SUTURE) IMPLANT
SUT MNCRL 4-0 (SUTURE) ×4
SUT MNCRL 4-0 27XMFL (SUTURE) ×2
SUT PDS AB 0 CT1 27 (SUTURE) ×6 IMPLANT
SUT PERFECTPASSER WHITE CART (SUTURE) ×8 IMPLANT
SUT SMART STITCH CARTRIDGE (SUTURE) ×12 IMPLANT
SUT ULTRABRAID 2 COBRAID 38 (SUTURE) IMPLANT
SUT VIC AB 0 CT1 36 (SUTURE) ×2 IMPLANT
SUT VIC AB 2-0 CT2 27 (SUTURE) ×2 IMPLANT
SUTURE ETHLN 4-0 FS2 18XMF BLK (SUTURE) ×1 IMPLANT
SUTURE MAGNUM WIRE 2X48 BLK (SUTURE) IMPLANT
SUTURE MNCRL 4-0 27XMF (SUTURE) ×2 IMPLANT
SYR 10ML LL (SYRINGE) ×2 IMPLANT
TAPE MICROFOAM 4IN (TAPE) ×2 IMPLANT
TUBING ARTHRO INFLOW-ONLY STRL (TUBING) ×2 IMPLANT
TUBING CONNECTING 10 (TUBING) ×2 IMPLANT
WAND HAND CNTRL MULTIVAC 90 (MISCELLANEOUS) ×2 IMPLANT
WRAPON POLAR PAD SHDR XLG (MISCELLANEOUS) ×2

## 2019-10-17 NOTE — Op Note (Signed)
10/17/2019  11:56 AM  PATIENT:  Alexander Kennedy  49 y.o. male  PRE-OPERATIVE DIAGNOSIS:  Right Shoulder Rotator Cuff Tear  POST-OPERATIVE DIAGNOSIS:  1.  Right shoulder full-thickness and retracted supraspinatus tear 2.  Partial-thickness subscapularis tear 3.  Partial tear of the long head of the biceps tendon with medial subluxation 4.  Subacromial impingement with extensive bursitis  5.  Loose body in the glenohumeral joint  PROCEDURE:  Procedure(s): 1.  Right shoulder arthroscopic subscapularis repair 2.  Arthroscopic biceps tenodesis 3.  Arthroscopic removal of loose body from the glenohumeral joint 4.  Arthroscopic subacromial decompression and extensive bursectomy 5.  Mini-open, double row supraspinatus repair  SURGEON:  Surgeon(s) and Role:    Thornton Park, MD - Primary  ANESTHESIA:   general and paracervical block  PREOPERATIVE INDICATIONS:  [name] is a  49 y.o. male with a diagnosis of a Right Shoulder Rotator Cuff Tear who failed conservative measures and elected for surgical management.    The risks benefits and alternatives were discussed with the patient preoperatively including but not limited to the risks of infection, bleeding, nerve injury, persistent pain or weakness, failure of the hardware, re-tear of the rotator cuff and the need for further surgery. Medical risks include DVT and pulmonary embolism, myocardial infarction, stroke, pneumonia, respiratory failure and death. Patient understood these risks and wished to proceed.  OPERATIVE IMPLANTS: ArthroCare Multifix anchors x 3 & [employer]hew Q Fix anchors x 2  OPERATIVE PROCEDURE: The patient was met in the preoperative area.  A preoperative history and physical was performed at the bedside.  The right shoulder was signed with the word yes and my initials according the hospital's correct site of surgery protocol. The patient underwent placement of a right shoulder interscalene block with Exparel  by the anesthesia service in the preoperative area.  Patient was brought to the operating room where they underwent general endotracheal intubation following placement of his interscalene block. The patient was placed in a beachchair position. A spider arm positioner was used for this case. Examination under anesthesia revealed no limitation of motion or instability with load shift testing. The patient had a negative sulcus sign.  The patient was prepped and draped in a sterile fashion. A timeout was performed to verify the patient's name, date of birth, medical record number, correct site of surgery and correct procedure to be performed there was also used to verify the patient received antibiotics that all appropriate instruments, implants and radiographs studies were available in the room. Once all in attendance were in agreement case began.  Bony landmarks were drawn out with a surgical marker along with proposed arthroscopy incisions.  An 11 blade was used to establish a posterior portal through which the arthroscope was placed in the glenohumeral joint. A full diagnostic examination of the shoulder was performed. the anterior portal was established under direct visualization with an 18-gauge spinal needle. A 5.75 the medial arthroscopic cannula was placed through this anterior portal.    The patient was found to have high-grade partial-thickness tear of the subscapularis involving the superior half of the tendon.  There was retraction of the segment of approximately a centimeter.  The subscapularis tear allowed for medial subluxation of the long head of the biceps tendon which was also partially torn.  Therefore the decision was made to perform a tenodesis. An Arthocare Perfect Pass suture placed into the intra-articular portion of the long head biceps tendon just proximal to the into tuberous groove via  a lateral portal.  The tendon was approached through the supraspinatus tear.   Once the tendon  was secured with a suture, a tenotomy was performed arthroscopy using an arthroscopic scissor.  The biceps tendon stump off the superior labrum was debrided with a 5.5 mm resector shaver blade through the lateral portal.  A second ArthroCare perfect Pass suture was placed into the superior half of the subscapularis tendon.  The lesser tuberosity and top of the intratuberous groove was debrided from the lateral portal with a 4.0 mm resector shaver blade.  Sutures from both the biceps tendon and subscapularis tendon were brought out through the anterior portal.  A T-handle punch was used to create a hole at the top of the intratuberous groove just lateral to the lesser tuberosity.  The sutures from the long head of the biceps tendon and subscapularis tendon were then loaded into a [employer] multi fix anchor which was placed into the hole created at the top of the intratuberous groove.  The sutures were tensioned to allow for approximation of the subscapularis to the lesser tuberosity and to secure the long head of the biceps tendon at the top of the tuberous groove.  Suture ends were then cut with an arthroscopic suture cutter.  Fraying of the labrum was debrided with a 4.0 mm resector shaver blade.  With probing, it was determined that there was no labral detachment from the osseous glenoid.  A large chondral loose body was removed from the glenohumeral joint using a [place] grasper through the anterior portal.  Arthroscopic images of the subscapularis repair and biceps tenodesis were taken and the arthroscope was removed.  The arthroscope was then placed in the subacromial space. Extensive bursitis was encountered and debrided using a 4.0 resector shaver blade and a 90 ArthroCare wand via the lateral portal. A subacromial decompression was also performed using a 5.5 mm resector shaver blade from the lateral portal.  No significant joint space narrowing was encountered upon examination of the [facility] joint and  a distal clavicle excision was not performed.    Two Arthrocare Perfect Pass suture was placed in the lateral border of the rotator cuff tear. The greater tuberosity was debrided using a 5.5 mm resector shaver blade to remove all remaining foreign fibers of the rotator cuff. Debridement was performed until punctate bleeding was seen at the greater tuberosity footprint, which will allow for rotator cuff healing.  Final arthroscopic images were taken and all arthroscopic instruments were then removed and the mini-open portion of the procedure began.   A saber-type incision was made along the lateral border of the acromion. The deltoid muscle was identified and split in line with its fibers which allowed visualization of the rotator cuff. The Perfect Pass suture previously placed in the lateral border of the rotator cuff were brought out through the deltoid split.   The lateral edge of the rotator cuff was debrided until healthy, viable tissue remained. Two Q fix anchors were then placed at the articular margin of the humeral head. The 4 limbs of each of these anchors were then passed medially through the rotator cuff with a Firstpass suture passer. These were clamped with a hemostat for later medial row fixation.  Three additional perfect pass sutures were placed in the lateral border of the rotator cuff for a total of 5.  Three of these perfect pass sutures were loaded into a [employer] multi fix anchor and placed into the greater tuberosity just posterior  to the intertubercular groove.  These anchors were tensioned to allow for anatomic reduction of the rotator cuff to the greater tuberosity footprint. A second [place] multi fix anchor was then loaded with the 2 posterior perfect Pass sutures and again tensioned to allow for anatomic reduction of the cuff to the greater tuberosity footprint.  The medial row repair was then completed using an arthroscopic knot tying technique with the Q fix  anchor sutures. Once all sutures were tied down, arthroscopic images of thedouble row repair were taken with the arthroscope both externally and arthroscopically fromthe glenohumeral joint.  All incisions were copiously irrigated. The deltoid fascia was repaired using a 0 Vicryl suturean interrupted fashion. . The subcutaneous tissue of all incisions were closed with a 2-0 Vicryl. Skin closure for the arthroscopic incisions was performed with 4-0 nylon. The skin edges of the saber incision were approximated with a running 4-0 undyed Monocryl. A dry sterile dressing was applied. The patient was placed in an abduction sling, with a Polar Care sleeve.  All sharp and instrument counts were correct at the conclusion of the case. I was scrubbed and present for the entire case. I spoke with the patient's wife postoperativelyby phone from the PACU to let her know the case had been performed without complication and the patient was stable in recovery room.

## 2019-10-17 NOTE — Anesthesia Procedure Notes (Signed)
Procedure Name: Intubation Date/Time: 10/17/2019 8:08 AM Performed by: Lowry Bowl, CRNA Pre-anesthesia Checklist: Patient identified, Emergency Drugs available, Suction available and Patient being monitored Patient Re-evaluated:Patient Re-evaluated prior to induction Oxygen Delivery Method: Circle system utilized Preoxygenation: Pre-oxygenation with 100% oxygen Induction Type: IV induction Ventilation: Mask ventilation without difficulty and Oral airway inserted - appropriate to patient size Laryngoscope Size: McGraph, 4 and Mac Grade View: Grade I Tube type: Oral Tube size: 7.0 mm Number of attempts: 1 Airway Equipment and Method: Stylet and Video-laryngoscopy Placement Confirmation: ETT inserted through vocal cords under direct vision,  positive ETCO2 and breath sounds checked- equal and bilateral Secured at: 22 cm Tube secured with: Tape Dental Injury: Teeth and Oropharynx as per pre-operative assessment

## 2019-10-17 NOTE — Anesthesia Postprocedure Evaluation (Signed)
Anesthesia Post Note  Patient: Alexander Kennedy  Procedure(s) Performed: SHOULDER ARTHROSCOPY WITH OPEN ROTATOR CUFF REPAIR AND biceps tenodysis (Right )  Patient location during evaluation: PACU Anesthesia Type: General Level of consciousness: awake and alert Pain management: pain level controlled Vital Signs Assessment: post-procedure vital signs reviewed and stable Respiratory status: spontaneous breathing, nonlabored ventilation, respiratory function stable and patient connected to nasal cannula oxygen Cardiovascular status: blood pressure returned to baseline and stable Postop Assessment: no apparent nausea or vomiting Anesthetic complications: no Comments: DBS turned back on by patient   No complications documented.   Last Vitals:  Vitals:   10/17/19 1200 10/17/19 1218  BP: 99/61 103/66  Pulse: 85 91  Resp: 19 16  Temp:  (!) 35.9 C  SpO2: 92% 94%    Last Pain:  Vitals:   10/17/19 1218  TempSrc: Temporal  PainSc: 0-No pain                 Arita Miss

## 2019-10-17 NOTE — Discharge Instructions (Signed)

## 2019-10-17 NOTE — H&P (Signed)
PREOPERATIVE H&P  Chief Complaint: Right Shoulder Rotator Cuff Tear  HPI: Alexander Kennedy is a 49 y.o. male who presents for preoperative history and physical with a diagnosis of Right Shoulder Rotator Cuff Tear sustained from a fall at work.  Symptoms of right shoulder pain with forward elevation and abduction, limited ROM and weakness are significantly impairing activities of daily living.  CT arthrogram has confirmed a full-thickness and retracted tear of the supraspinatus and subscapularis with medial subluxation of the biceps.  Patient has failed nonoperative management including physical therapy.  Patient wished to proceed with surgical fixation of his rotator cuff tear with possible tenodesis.  Past Medical History:  Diagnosis Date  . COPD (chronic obstructive pulmonary disease) ([place])   . GERD (gastroesophageal reflux disease)    RARE  . Hypertension   . Inguinal hernia, bilateral   . Pneumonia   . Psoriasis   . Sleep apnea    USES CPAP  . Tremors of nervous system    Past Surgical History:  Procedure Laterality Date  . BURR HOLE W/ STEREOTACTIC INSERTION OF DBS LEADS / INTRAOP MICROELECTRODE RECORDING    . KNEE SURGERY Left    Social History   Socioeconomic History  . Marital status: Married    Spouse name: Not on file  . Number of children: Not on file  . Years of education: Not on file  . Highest education level: Not on file  Occupational History  . Not on file  Tobacco Use  . Smoking status: Former Smoker    Packs/day: 1.00    Years: 30.00    Pack years: 30.00    Types: Cigarettes    Quit date: 10/03/2019    Years since quitting: 0.0  . Smokeless tobacco: Never Used  Vaping Use  . Vaping Use: Never used  Substance and Sexual Activity  . Alcohol use: No  . Drug use: No  . Sexual activity: Not on file  Other Topics Concern  . Not on file  Social History Narrative   Patient lives at home alone.    Patient is right handed.    Social Determinants of Health    Financial Resource Strain:   . Difficulty of Paying Living Expenses: Not on file  Food Insecurity:   . Worried About Charity fundraiser in the Last Year: Not on file  . Ran Out of Food in the Last Year: Not on file  Transportation Needs:   . Lack of Transportation (Medical): Not on file  . Lack of Transportation (Non-Medical): Not on file  Physical Activity:   . Days of Exercise per Week: Not on file  . Minutes of Exercise per Session: Not on file  Stress:   . Feeling of Stress : Not on file  Social Connections:   . Frequency of Communication with Friends and Family: Not on file  . Frequency of Social Gatherings with Friends and Family: Not on file  . Attends Religious Services: Not on file  . Active Member of Clubs or Organizations: Not on file  . Attends Archivist Meetings: Not on file  . Marital Status: Not on file   History reviewed. No pertinent family history. Allergies  Allergen Reactions  . Penicillins Shortness Of Breath and Other (See Comments)    Dizziness also Has patient had a PCN reaction causing immediate rash, facial/tongue/throat swelling, SOB or lightheadedness with hypotension: Yes Has patient had a PCN reaction causing severe rash involving mucus membranes or skin necrosis: No Has  patient had a PCN reaction that required hospitalization: No Has patient had a PCN reaction occurring within the last 10 years: No If all of the above answers are "NO", then may proceed with Cephalosporin use.    Prior to Admission medications   Medication Sig Start Date End Date Taking? Authorizing Provider  albuterol (PROVENTIL HFA;VENTOLIN HFA) 108 (90 Base) MCG/ACT inhaler Inhale 1-2 puffs into the lungs every 6 (six) hours as needed for wheezing or shortness of breath. 01/22/17  Yes Caryl Ada K, PA-C  amLODipine (NORVASC) 5 MG tablet Take 5 mg by mouth every evening. 09/16/19  Yes [provider]  meloxicam (MOBIC) 15 MG tablet Take 15 mg by mouth  every evening. 09/14/19  Yes [provider]  methocarbamol (ROBAXIN) 500 MG tablet Take 500 mg by mouth 2 (two) times daily as needed for spasms. 09/14/19  Yes [provider]  SKYRIZI, 150 MG DOSE, 75 MG/0.83ML PSKT Inject 75 mg into the skin every 3 (three) months. 07/08/19  Yes [provider]  traZODone (DESYREL) 100 MG tablet Take 100 mg by mouth at bedtime. 09/16/19  Yes [provider]  ibuprofen (ADVIL) 600 MG tablet Take 1 tablet (600 mg total) by mouth 3 (three) times daily. Take with food Patient not taking: Reported on 10/08/2019 06/11/19   Kem Parkinson, PA-C  SYMBICORT 80-4.5 MCG/ACT inhaler Inhale 2 puffs into the lungs in the morning and at bedtime. 10/07/19   [provider]  traMADol (ULTRAM) 50 MG tablet Take 1 tablet (50 mg total) by mouth every 6 (six) hours as needed. Patient not taking: Reported on 10/08/2019 06/11/19   Triplett, Tammy, PA-C     Positive ROS: All other systems have been reviewed and were otherwise negative with the exception of those mentioned in the HPI and as above.  Physical Exam: General: Alert, no acute distress Cardiovascular: Regular rate and rhythm, no murmurs rubs or gallops.  No pedal edema Respiratory: Clear to auscultation bilaterally, no wheezes rales or rhonchi. No cyanosis, no use of accessory musculature GI: No organomegaly, abdomen is soft and non-tender nondistended with positive bowel sounds. Skin: Skin intact, no lesions within the operative field. Neurologic: Sensation intact distally Psychiatric: Patient is competent for consent with normal mood and affect Lymphatic: No cervical lymphadenopathy  MUSCULOSKELETAL: Right shoulder: Patient skin is intact.  There is no erythema, ecchymosis, swelling, deformity or muscle atrophy.  Patient has pain with forward elevation and abduction at 80 to 90 degrees.  He demonstrates weakness to shoulder abduction greater than internal and external rotation.   Patient is full digital reasonable range of motion, intact/light touch and a palpable radial pulse.  He is positive impingement signs but no apprehension or instability.  Assessment: Right Shoulder Rotator Cuff Tear  Plan: Plan for Procedure(s): RIGHT SHOULDER ARTHROSCOPIC SUBACROMIAL DECOMPRESSION, DISTAL CLAVICLE EXCISION, MINI-OPEN ROTATOR CUFF TEAR AND POSSIBLE BICEPS TENOTOMY  I reviewed the details of the operation as well as the postoperative course with the patient.  I discussed the risks and benefits of surgery. The risks include but are not limited to infection, bleeding, nerve or blood vessel injury, joint stiffness or loss of motion, persistent pain, weakness or instability, retear of the rotator cuff, failure of the repair or hardware failure and the need for further surgery. Medical risks include but are not limited to DVT and pulmonary embolism, myocardial infarction, stroke, pneumonia, respiratory failure and death. Patient understood these risks and wished to proceed.     Thornton Park, MD  10/17/2019 7:51 AM

## 2019-10-17 NOTE — Anesthesia Procedure Notes (Signed)
Anesthesia Regional Block: Interscalene brachial plexus block   Pre-Anesthetic Checklist: ,, timeout performed, Correct Patient, Correct Site, Correct Laterality, Correct Procedure, Correct Position, site marked, Risks and benefits discussed,  Surgical consent,  Pre-op evaluation,  At surgeon's request and post-op pain management  Laterality: Right  Prep: chloraprep       Needles:  Injection technique: Single-shot  Needle Type: Echogenic Needle     Needle Length: 4cm  Needle Gauge: 25     Additional Needles:   Narrative:  Start time: 10/17/2019 7:45 AM End time: 10/17/2019 7:50 AM Injection made incrementally with aspirations every 5 mL.  Performed by: Personally  Anesthesiologist: Arita Miss, MD  Additional Notes: Patient consented for risk and benefits of nerve block including but not limited to: nerve damage, failed block, bleeding and infection, hemidiaphragmatic paralysis and shortness of breath, Horner's syndrome.  Patient voiced understanding.  Functioning IV was confirmed and monitors were applied.  Sterile prep,hand hygiene and sterile gloves were used.  Minimal sedation used for procedure.   No paresthesia endorsed by patient during the procedure.  Negative aspiration and negative test dose prior to incremental administration of local anesthetic. The patient tolerated the procedure well with no immediate complications.

## 2019-10-17 NOTE — Anesthesia Preprocedure Evaluation (Signed)
Anesthesia Evaluation  Patient identified by MRN, date of birth, ID band Patient awake    Reviewed: Allergy & Precautions, NPO status , Patient's Chart, lab work & pertinent test results  History of Anesthesia Complications Negative for: history of anesthetic complications  Airway Mallampati: II  TM Distance: >3 FB Neck ROM: Full    Dental no notable dental hx. (+) Teeth Intact, Dental Advisory Given   Pulmonary sleep apnea and Continuous Positive Airway Pressure Ventilation , COPD,  COPD inhaler, Current Smoker and Patient abstained from smoking.,    Pulmonary exam normal breath sounds clear to auscultation       Cardiovascular Exercise Tolerance: Good METShypertension, (-) CAD and (-) Past MI (-) dysrhythmias  Rhythm:Regular Rate:Normal - Systolic murmurs    Neuro/Psych Essential tremor s/p DBS implantation, left sided leads negative psych ROS   GI/Hepatic GERD  Controlled,(+)     (-) substance abuse  ,   Endo/Other  neg diabetes  Renal/GU negative Renal ROS     Musculoskeletal   Abdominal   Peds  Hematology   Anesthesia Other Findings Past Medical History: No date: COPD (chronic obstructive pulmonary disease) ([facility]) No date: GERD (gastroesophageal reflux disease)     Comment:  RARE No date: Hypertension No date: Inguinal hernia, bilateral No date: Pneumonia No date: Psoriasis No date: Sleep apnea     Comment:  USES CPAP No date: Tremors of nervous system  Reproductive/Obstetrics                             Anesthesia Physical Anesthesia Plan  ASA: III  Anesthesia Plan: General   Post-op Pain Management:  Regional for Post-op pain   Induction: Intravenous  PONV Risk Score and Plan: 2 and Ondansetron, Dexamethasone and Midazolam  Airway Management Planned: Oral ETT  Additional Equipment: None  Intra-op Plan:   Post-operative Plan: Extubation in OR  Informed  Consent: I have reviewed the patients History and Physical, chart, labs and discussed the procedure including the risks, benefits and alternatives for the proposed anesthesia with the patient or authorized representative who has indicated his/her understanding and acceptance.     Dental advisory given  Plan Discussed with: CRNA and Surgeon  Anesthesia Plan Comments: (See prior notes regarding patient's DBS stimulator.  Discussed r/b/a of interscalene block, including elective nature. Risks discussed: - Rare: bleeding, infection, nerve damage - shortness of breath from hemidiaphragmatic paralysis, potentially increased in patients with respiratory disease. However this patient's respiratory disease seems controlled and not severe. - unilateral horner's syndrome - poor/non-working blocks Patient understands and agrees.   Discussed risks of anesthesia with patient, including PONV, sore throat, lip/dental damage. Rare risks discussed as well, such as cardiorespiratory and neurological sequelae. Patient understands.)        Anesthesia Quick Evaluation

## 2019-10-17 NOTE — Transfer of Care (Signed)
Immediate Anesthesia Transfer of Care Note  Patient: Alexander Kennedy  Procedure(s) Performed: SHOULDER ARTHROSCOPY WITH OPEN ROTATOR CUFF REPAIR AND biceps tenodysis (Right )  Patient Location: PACU  Anesthesia Type:GA combined with regional for post-op pain  Level of Consciousness: awake, drowsy and patient cooperative  Airway & Oxygen Therapy: Patient Spontanous Breathing and Patient connected to face mask oxygen  Post-op Assessment: Report given to RN and Post -op Vital signs reviewed and stable  Post vital signs: Reviewed and stable  Last Vitals:  Vitals Value Taken Time  BP 120/77 10/17/19 1100  Temp    Pulse 89 10/17/19 1108  Resp 20 10/17/19 1108  SpO2 100 % 10/17/19 1108  Vitals shown include unvalidated device data.  Last Pain:  Vitals:   10/17/19 0626  PainSc: 5          Complications: No complications documented.

## 2019-10-18 ENCOUNTER — Encounter: Payer: Self-pay | Admitting: Orthopedic Surgery

## 2020-04-09 ENCOUNTER — Other Ambulatory Visit: Payer: Self-pay | Admitting: Orthopedic Surgery

## 2020-04-09 DIAGNOSIS — M25511 Pain in right shoulder: Secondary | ICD-10-CM

## 2020-04-27 ENCOUNTER — Ambulatory Visit
Admission: RE | Admit: 2020-04-27 | Discharge: 2020-04-27 | Disposition: A | Discharge status: Home / Self Care | Payer: Self-pay | Source: Ambulatory Visit | Attending: Orthopedic Surgery | Admitting: Orthopedic Surgery

## 2020-04-27 ENCOUNTER — Other Ambulatory Visit: Payer: Self-pay

## 2020-04-27 ENCOUNTER — Ambulatory Visit
Admission: RE | Admit: 2020-04-27 | Discharge: 2020-04-27 | Disposition: A | Discharge status: Home / Self Care | Payer: Worker's Compensation | Source: Ambulatory Visit | Attending: Orthopedic Surgery | Admitting: Orthopedic Surgery

## 2020-04-27 DIAGNOSIS — M25511 Pain in right shoulder: Secondary | ICD-10-CM

## 2020-04-27 MED ORDER — IOPAMIDOL (ISOVUE-M 200) INJECTION 41%
15.0000 mL | Freq: Once | INTRAMUSCULAR | Status: AC
  Administered 2020-04-27: 15 mL via INTRA_ARTICULAR

## 2020-05-13 ENCOUNTER — Other Ambulatory Visit: Payer: Self-pay

## 2020-05-13 ENCOUNTER — Ambulatory Visit (HOSPITAL_COMMUNITY)
Admission: RE | Admit: 2020-05-13 | Discharge: 2020-05-13 | Disposition: A | Discharge status: Home / Self Care | Payer: Medicaid Other | Source: Ambulatory Visit | Attending: Family Medicine | Admitting: Family Medicine

## 2020-05-13 ENCOUNTER — Other Ambulatory Visit (HOSPITAL_COMMUNITY): Payer: Self-pay | Admitting: Family Medicine

## 2020-05-13 DIAGNOSIS — J449 Chronic obstructive pulmonary disease, unspecified: Secondary | ICD-10-CM | POA: Diagnosis not present

## 2020-05-14 ENCOUNTER — Ambulatory Visit
Admission: RE | Admit: 2020-05-14 | Discharge: 2020-05-14 | Disposition: A | Discharge status: Home / Self Care | Payer: Medicaid Other | Source: Ambulatory Visit | Attending: Family Medicine | Admitting: Family Medicine

## 2020-05-14 DIAGNOSIS — Z01810 Encounter for preprocedural cardiovascular examination: Secondary | ICD-10-CM | POA: Diagnosis not present

## 2020-07-01 ENCOUNTER — Telehealth: Payer: Self-pay | Admitting: *Deleted

## 2020-07-01 NOTE — Telephone Encounter (Signed)
Received referral from [facility].  Called pt and he requested to be referred to  GI.  Called PCP and left voice message notifying them of this information.

## 2020-08-20 ENCOUNTER — Ambulatory Visit: Payer: Medicaid Other | Admitting: Gastroenterology

## 2020-08-20 ENCOUNTER — Other Ambulatory Visit: Payer: Self-pay

## 2020-08-20 ENCOUNTER — Encounter: Payer: Self-pay | Admitting: Gastroenterology

## 2020-08-20 VITALS — BP 147/93 | HR 79 | Temp 97.7°F | Ht 67.0 in | Wt 205.0 lb

## 2020-08-20 DIAGNOSIS — D696 Thrombocytopenia, unspecified: Secondary | ICD-10-CM

## 2020-08-20 DIAGNOSIS — K5900 Constipation, unspecified: Secondary | ICD-10-CM

## 2020-08-20 MED ORDER — NA SULFATE-K SULFATE-MG SULF 17.5-3.13-1.6 GM/177ML PO SOLN: 1.0000 | Freq: Once | ORAL | 0 refills | Status: AC

## 2020-08-20 MED ORDER — POLYETHYLENE GLYCOL 3350 17 GM/SCOOP PO POWD: 17.0000 g | Freq: Every day | ORAL | 2 refills | Status: DC

## 2020-08-20 NOTE — Progress Notes (Signed)
Alexander Kennedy [address]  [place]  [place], [place] [address]  Main: [phone]  Fax: [phone]   Gastroenterology Consultation  Referring Provider:     Alanson Puls [facility] Primary Care Physician:  Denyce Robert, [place] Reason for Consultation:     Constipation        HPI:    Chief Complaint  Patient presents with   New Patient (Initial Visit)   Constipation    Taking stool softeners QD in order to have BM... stools were very firm, denies blood in stools... denies n/v... pt reports 2 maternal uncles have passed from colon cancer    Alexander Kennedy is a 50 y.o. y/o male referred for consultation & management  by Dr. Denyce Robert, [place].  Patient reports having chronic constipation and taking stool softeners once every night for 2 years.  Has had constipation prior to that as well.  States if he does not take the stool softener he does not have a bowel movement for 1 week at a time.  Reports 1-2 bowel movements with the stool softener.  No blood in stool.  No weight loss.  No nausea or vomiting.  No abdominal pain.  No dysphagia.  No prior colonoscopy.  Reports family history of colon cancer in 2 uncles.  Past Medical History:  Diagnosis Date   COPD (chronic obstructive pulmonary disease) ([facility])    GERD (gastroesophageal reflux disease)    RARE   Hypertension    Inguinal hernia, bilateral    Pneumonia    Psoriasis    Sleep apnea    USES CPAP   Tremors of nervous system     Past Surgical History:  Procedure Laterality Date   BURR HOLE W/ STEREOTACTIC INSERTION OF DBS LEADS / INTRAOP MICROELECTRODE RECORDING     KNEE SURGERY Left    SHOULDER ARTHROSCOPY WITH OPEN ROTATOR CUFF REPAIR AND DISTAL CLAVICLE ACROMINECTOMY Right 10/17/2019   Procedure: SHOULDER ARTHROSCOPY WITH OPEN ROTATOR CUFF REPAIR AND biceps tenodysis;  Surgeon: Thornton Park, MD;  Location: [facility] ORS;  Service: Orthopedics;  Laterality: Right;    Prior to Admission medications    Medication Sig Start Date End Date Taking? Authorizing Provider  albuterol (PROVENTIL HFA;VENTOLIN HFA) 108 (90 Base) MCG/ACT inhaler Inhale 1-2 puffs into the lungs every 6 (six) hours as needed for wheezing or shortness of breath. 01/22/17  Yes Caryl Ada K, PA-C  amLODipine (NORVASC) 5 MG tablet Take 5 mg by mouth every evening. 09/16/19  Yes [provider]  meloxicam (MOBIC) 15 MG tablet Take 15 mg by mouth every evening. 09/14/19  Yes [provider]  methocarbamol (ROBAXIN) 500 MG tablet Take 500 mg by mouth 2 (two) times daily as needed for spasms. 09/14/19  Yes [provider]  Na Sulfate-K Sulfate-Mg Sulf 17.5-3.13-1.6 GM/177ML SOLN Take 1 kit by mouth once for 1 dose. 08/20/20 08/20/20 Yes Virgel Manifold, MD  ondansetron (ZOFRAN) 4 MG tablet Take 1 tablet (4 mg total) by mouth every 8 (eight) hours as needed for nausea or vomiting. 10/17/19  Yes Thornton Park, MD  oxyCODONE (OXY IR/ROXICODONE) 5 MG immediate release tablet Take 1 tablet (5 mg total) by mouth every 4 (four) hours as needed. 10/17/19  Yes Thornton Park, MD  polyethylene glycol powder (GLYCOLAX/MIRALAX) 17 GM/SCOOP powder Take 17 g by mouth daily. 08/20/20  Yes Zuley Lutter, Lennette Bihari, MD  SKYRIZI, 150 MG DOSE, 75 MG/0.83ML PSKT Inject 75 mg into the skin every 3 (three) months. 07/08/19  Yes  [provider]  SYMBICORT 80-4.5 MCG/ACT inhaler Inhale 2 puffs into the lungs in the morning and at bedtime. 10/07/19  Yes [provider]  traZODone (DESYREL) 100 MG tablet Take 100 mg by mouth at bedtime. 09/16/19  Yes [provider]    Family History  Problem Relation Age of Onset   Congestive Heart Failure Father    Colon cancer Maternal Uncle      Social History   Tobacco Use   Smoking status: Former    Packs/day: 1.00    Years: 30.00    Pack years: 30.00    Types: Cigarettes    Quit date: 10/03/2019    Years since quitting: 0.8   Smokeless tobacco: Never   Vaping Use   Vaping Use: Never used  Substance Use Topics   Alcohol use: No   Drug use: No    Allergies as of 08/20/2020 - Review Complete 08/20/2020  Allergen Reaction Noted   Penicillins Shortness Of Breath and Other (See Comments) 03/08/2016    Review of Systems:    All systems reviewed and negative except where noted in HPI.   Physical Exam:  Constitutional: General:   Alert,  Well-developed, well-nourished, pleasant and cooperative in NAD BP (!) 147/93   Pulse 79   Temp 97.7 F (36.5 C) (Oral)   Ht _0  (1.702 m)   Wt 205 lb (93 kg)   BMI 32.11 kg/m   Eyes:  Sclera clear, no icterus.   Conjunctiva pink. PERRLA  Ears:  No scars, lesions or masses, Normal auditory acuity. Nose:  No deformity, discharge, or lesions. Mouth:  No deformity or lesions, oropharynx pink & moist.  Neck:  Supple; no masses or thyromegaly.  Respiratory: Normal respiratory effort, Normal percussion  Gastrointestinal:   Soft, non-tender and non-distended without masses, hepatosplenomegaly or hernias noted.  No guarding or rebound tenderness.     Cardiac: No clubbing or edema.  No cyanosis. Normal posterior tibial pedal pulses noted.  Lymphatic:  No significant cervical or axillary adenopathy.  Psych:  Alert and cooperative. Normal mood and affect.  Musculoskeletal:  Normal gait. Head normocephalic, atraumatic. Symmetrical without gross deformities. 5/5 Upper and Lower extremity strength bilaterally.  Skin: Warm. Intact without significant lesions or rashes. No jaundice.  Neurologic:  Face symmetrical, tongue midline, Normal sensation to touch;  grossly normal neurologically.  Psych:  Alert and oriented x3, Alert and cooperative. Normal mood and affect.   Labs: CBC    Component Value Date/Time   WBC 5.0 07/18/2019 1026   RBC 4.86 07/18/2019 1026   HGB 15.0 07/18/2019 1026   HCT 44.0 07/18/2019 1026   PLT 134 (L) 07/18/2019 1026   MCV 90.5 07/18/2019 1026   MCH 30.9 07/18/2019  1026   MCHC 34.1 07/18/2019 1026   RDW 12.3 07/18/2019 1026   LYMPHSABS 2.2 09/14/2016 1429   MONOABS 0.5 09/14/2016 1429   EOSABS 0.1 09/14/2016 1429   BASOSABS 0.0 09/14/2016 1429   CMP     Component Value Date/Time   NA 140 07/18/2019 1026   K 3.9 07/18/2019 1026   CL 106 07/18/2019 1026   CO2 27 07/18/2019 1026   GLUCOSE 102 (H) 07/18/2019 1026   BUN 20 07/18/2019 1026   CREATININE 0.88 07/18/2019 1026   CALCIUM 9.0 07/18/2019 1026   PROT 6.6 09/14/2016 1429   ALBUMIN 4.2 09/14/2016 1429   AST 14 (L) 09/14/2016 1429   ALT 16 (L) 09/14/2016 1429   ALKPHOS 44 09/14/2016 1429  BILITOT 0.9 09/14/2016 1429   GFRNONAA >60 07/18/2019 1026   GFRAA >60 07/18/2019 1026    Imaging Studies:   Assessment and Plan:   Alexander Kennedy is a 50 y.o. y/o male has been referred for constipation  No alarm symptoms present and constipation is chronic and is likely due to inadequate fiber intake  High-fiber diet MiraLAX daily with goal of 1-2 soft bowel movements daily.  If not at goal, patient instructed to increase dose to twice daily.  If loose stools with the medication, patient asked to decrease the medication to every other day, or half dose daily.  Patient verbalized understanding  Stop over-the-counter stool softeners as it is a stimulant laxative  I also noted that patient had thrombocytopenia on last 2 CBCs with platelet of 134 in May 2021 and low in 2018 as well at 140  Patient does not recall ever being told that he has thrombocytopenia or any work-up for the same.  I have ordered repeat labs to reevaluate this and also ordered CMP with this to ensure his liver and enzymes are normal.  Patient denies any previous history of alcohol abuse or history of cirrhosis to attribute to his thrombocytopenia.  PCP notes reviewed under media that were scanned in, and do not mention any history of thrombocytopenia or any work-up for the same on that 1 note.  If patient still has  persistent thrombocytopenia, we may need to reach out to PCP or consider hematology referral for the same.  Dr Alexander Kennedy  Speech recognition software was used to dictate the above note.

## 2020-08-20 NOTE — Patient Instructions (Signed)
-  Stop over the counter stool softener at this time -New prescription for Miralax to take daily -Follow up in 3 months  Will need to have labs completed either at our lab hours Mon 1-4, Tues-Wed 8:30-4, Thurs 1-4  Or you can go to any [facility] drawing station

## 2020-08-21 NOTE — Addendum Note (Signed)
Addended by: Lurlean Nanny on: 08/21/2020 09:19 AM   Modules accepted: Orders, SmartSet

## 2020-08-26 ENCOUNTER — Encounter: Payer: Self-pay | Admitting: Gastroenterology

## 2020-08-27 ENCOUNTER — Ambulatory Visit
Admission: RE | Admit: 2020-08-27 | Discharge: 2020-08-27 | Disposition: A | Discharge status: Home / Self Care | Payer: Medicaid Other | Attending: Gastroenterology | Admitting: Gastroenterology

## 2020-08-27 ENCOUNTER — Encounter: Payer: Self-pay | Admitting: Gastroenterology

## 2020-08-27 ENCOUNTER — Ambulatory Visit: Payer: Medicaid Other | Admitting: Registered Nurse

## 2020-08-27 ENCOUNTER — Encounter
Admission: RE | Disposition: A | Discharge status: Home / Self Care | Payer: Self-pay | Source: Home / Self Care | Attending: Gastroenterology

## 2020-08-27 DIAGNOSIS — Z791 Long term (current) use of non-steroidal anti-inflammatories (NSAID): Secondary | ICD-10-CM | POA: Insufficient documentation

## 2020-08-27 DIAGNOSIS — Z880 Allergy status to penicillin: Secondary | ICD-10-CM | POA: Insufficient documentation

## 2020-08-27 DIAGNOSIS — Z1211 Encounter for screening for malignant neoplasm of colon: Secondary | ICD-10-CM | POA: Diagnosis not present

## 2020-08-27 DIAGNOSIS — K635 Polyp of colon: Secondary | ICD-10-CM | POA: Diagnosis not present

## 2020-08-27 DIAGNOSIS — Z7951 Long term (current) use of inhaled steroids: Secondary | ICD-10-CM | POA: Insufficient documentation

## 2020-08-27 DIAGNOSIS — Z87891 Personal history of nicotine dependence: Secondary | ICD-10-CM | POA: Diagnosis not present

## 2020-08-27 DIAGNOSIS — K648 Other hemorrhoids: Secondary | ICD-10-CM | POA: Diagnosis not present

## 2020-08-27 DIAGNOSIS — K6289 Other specified diseases of anus and rectum: Secondary | ICD-10-CM | POA: Insufficient documentation

## 2020-08-27 DIAGNOSIS — K5900 Constipation, unspecified: Secondary | ICD-10-CM

## 2020-08-27 HISTORY — PX: COLONOSCOPY WITH PROPOFOL: SHX5780

## 2020-08-27 SURGERY — COLONOSCOPY WITH PROPOFOL
Anesthesia: General

## 2020-08-27 MED ORDER — SODIUM CHLORIDE 0.9 % IV SOLN
INTRAVENOUS | Status: DC
  Administered 2020-08-27: 1000 mL via INTRAVENOUS

## 2020-08-27 MED ORDER — PROPOFOL 500 MG/50ML IV EMUL
INTRAVENOUS | Status: AC
  Filled 2020-08-27: qty 100

## 2020-08-27 MED ORDER — PROPOFOL 500 MG/50ML IV EMUL
INTRAVENOUS | Status: DC | PRN
  Administered 2020-08-27: 150 ug/kg/min via INTRAVENOUS

## 2020-08-27 MED ORDER — PHENYLEPHRINE HCL (PRESSORS) 10 MG/ML IV SOLN
INTRAVENOUS | Status: AC
  Filled 2020-08-27: qty 1

## 2020-08-27 MED ORDER — PROPOFOL 10 MG/ML IV BOLUS
INTRAVENOUS | Status: DC | PRN
  Administered 2020-08-27: 100 mg via INTRAVENOUS

## 2020-08-27 MED ORDER — LIDOCAINE HCL (CARDIAC) PF 100 MG/5ML IV SOSY
PREFILLED_SYRINGE | INTRAVENOUS | Status: DC | PRN
  Administered 2020-08-27: 100 mg via INTRAVENOUS

## 2020-08-27 MED ORDER — LIDOCAINE HCL (PF) 2 % IJ SOLN
INTRAMUSCULAR | Status: AC
  Filled 2020-08-27: qty 10

## 2020-08-27 NOTE — Anesthesia Postprocedure Evaluation (Signed)
Anesthesia Post Note  Patient: Alexander Kennedy  Procedure(s) Performed: COLONOSCOPY WITH PROPOFOL  Patient location during evaluation: PACU Anesthesia Type: General Level of consciousness: awake and alert Pain management: pain level controlled Vital Signs Assessment: post-procedure vital signs reviewed and stable Respiratory status: spontaneous breathing, nonlabored ventilation, respiratory function stable and patient connected to nasal cannula oxygen Cardiovascular status: blood pressure returned to baseline and stable Postop Assessment: no apparent nausea or vomiting Anesthetic complications: no   No notable events documented.   Last Vitals:  Vitals:   08/27/20 0846 08/27/20 0856  BP: 114/79 (!) 132/97  Pulse:    Resp:    Temp: (!) 35.9 C   SpO2:      Last Pain:  Vitals:   08/27/20 0916  TempSrc:   PainSc: 0-No pain                 Martha Clan

## 2020-08-27 NOTE — Anesthesia Preprocedure Evaluation (Signed)
Anesthesia Evaluation  Patient identified by MRN, date of birth, ID band Patient awake    Reviewed: Allergy & Precautions, NPO status , Patient's Chart, lab work & pertinent test results  History of Anesthesia Complications Negative for: history of anesthetic complications  Airway Mallampati: II  TM Distance: >3 FB Neck ROM: Full    Dental no notable dental hx. (+) Teeth Intact, Dental Advisory Given   Pulmonary neg shortness of breath, sleep apnea , COPD,  COPD inhaler, neg recent URI, Patient abstained from smoking., former smoker,    Pulmonary exam normal breath sounds clear to auscultation       Cardiovascular Exercise Tolerance: Good METShypertension, (-) angina(-) CAD and (-) Past MI (-) dysrhythmias (-) Valvular Problems/Murmurs Rhythm:Regular Rate:Normal - Systolic murmurs    Neuro/Psych Essential tremor s/p DBS implantation, left sided leads negative neurological ROS  negative psych ROS   GI/Hepatic GERD  Controlled,(+)     (-) substance abuse  ,   Endo/Other  neg diabetes  Renal/GU negative Renal ROS     Musculoskeletal   Abdominal   Peds  Hematology   Anesthesia Other Findings Past Medical History: No date: COPD (chronic obstructive pulmonary disease) (HCC) No date: GERD (gastroesophageal reflux disease)     Comment:  RARE No date: Hypertension No date: Inguinal hernia, bilateral No date: Pneumonia No date: Psoriasis No date: Sleep apnea     Comment:  USES CPAP No date: Tremors of nervous system  Reproductive/Obstetrics                             Anesthesia Physical  Anesthesia Plan  ASA: 3  Anesthesia Plan: General   Post-op Pain Management:    Induction: Intravenous  PONV Risk Score and Plan: 2 and TIVA and Propofol infusion  Airway Management Planned: Natural Airway and Nasal Cannula  Additional Equipment: None  Intra-op Plan:   Post-operative Plan:    Informed Consent: I have reviewed the patients History and Physical, chart, labs and discussed the procedure including the risks, benefits and alternatives for the proposed anesthesia with the patient or authorized representative who has indicated his/her understanding and acceptance.     Dental advisory given  Plan Discussed with: CRNA and Surgeon  Anesthesia Plan Comments: (See prior notes regarding patient's DBS stimulator.  Discussed r/b/a of interscalene block, including elective nature. Risks discussed: - Rare: bleeding, infection, nerve damage - shortness of breath from hemidiaphragmatic paralysis, potentially increased in patients with respiratory disease. However this patient's respiratory disease seems controlled and not severe. - unilateral horner's syndrome - poor/non-working blocks Patient understands and agrees.   Discussed risks of anesthesia with patient, including PONV, sore throat, lip/dental damage. Rare risks discussed as well, such as cardiorespiratory and neurological sequelae. Patient understands.)        Anesthesia Quick Evaluation

## 2020-08-27 NOTE — Op Note (Signed)
[facility] Patient Name: Martise Waddell Procedure Date: 08/27/2020 8:06 AM MRN: [id number] Account #: [id number] Date of Birth: [birth year] Admit Type: Outpatient Age: 50 Room: [facility] ENDO ROOM 2 Gender: Male Note Status: Finalized Procedure:             Colonoscopy Indications:           Screening for colorectal malignant neoplasm Providers:             Ranald Alessio B. Bonna Gains MD, MD Referring MD:          Denyce Robert (Referring MD) Medicines:             Monitored Anesthesia Care Complications:         No immediate complications. Procedure:             Pre-Anesthesia Assessment:                        - ASA Grade Assessment: II - A patient with mild                         systemic disease.                        - Prior to the procedure, a History and Physical was                         performed, and patient medications, allergies and                         sensitivities were reviewed. The patient's tolerance                         of previous anesthesia was reviewed.                        - The risks and benefits of the procedure and the                         sedation options and risks were discussed with the                         patient. All questions were answered and informed                         consent was obtained.                        - Patient identification and proposed procedure were                         verified prior to the procedure by the physician, the                         nurse, the anesthesiologist, the anesthetist and the                         technician. The procedure was verified in the                         procedure room.  After obtaining informed consent, the colonoscope was                         passed under direct vision. Throughout the procedure,                         the patient's blood pressure, pulse, and oxygen                         saturations were monitored  continuously. The                         Colonoscope was introduced through the anus and                         advanced to the the cecum, identified by appendiceal                         orifice and ileocecal valve. The colonoscopy was                         performed with ease. The patient tolerated the                         procedure well. The quality of the bowel preparation                         was good. Findings:      The perianal and digital rectal examinations were normal.      A 4 mm polyp was found in the sigmoid colon. The polyp was flat. The       polyp was removed with a jumbo cold forceps. Resection and retrieval       were complete.      The exam was otherwise without abnormality.      The rectum, sigmoid colon, descending colon, transverse colon, ascending       colon and cecum appeared normal.      Non-bleeding internal hemorrhoids were found during retroflexion.      Anal papilla(e) were hypertrophied.      No additional abnormalities were found on retroflexion. Impression:            - One 4 mm polyp in the sigmoid colon, removed with a                         jumbo cold forceps. Resected and retrieved.                        - The examination was otherwise normal.                        - The rectum, sigmoid colon, descending colon,                         transverse colon, ascending colon and cecum are normal.                        - Non-bleeding internal hemorrhoids.                        -  Anal papilla(e) were hypertrophied. Recommendation:        - Await pathology results.                        - High fiber diet.                        - Discharge patient to home (with escort).                        - Advance diet as tolerated.                        - Continue present medications.                        - Repeat colonoscopy date to be determined after                         pending pathology results are reviewed.                        - The  findings and recommendations were discussed with                         the patient.                        - The findings and recommendations were discussed with                         the patient's family.                        - Return to primary care physician as previously                         scheduled. Procedure Code(s):     --- Professional ---                        [phone], Colonoscopy, flexible; with biopsy, single or                         multiple Diagnosis Code(s):     --- Professional ---                        Z12.11, Encounter for screening for malignant neoplasm                         of colon                        K63.5, Polyp of colon CPT copyright 2019 American Medical Association. All rights reserved. The codes documented in this report are preliminary and upon coder review may  be revised to meet current compliance requirements.  [name], MD Margretta Sidle B. Bonna Gains MD, MD 08/27/2020 8:57:01 AM This report has been signed electronically. Number of Addenda: 0 Note Initiated On: 08/27/2020 8:06 AM Scope Withdrawal Time: 0 hours 22 minutes 17 seconds  Total Procedure Duration: 0 hours 29 minutes 45 seconds  Estimated Blood Loss:  Estimated blood  loss: none.      [facility]

## 2020-08-27 NOTE — H&P (Signed)
Alexander Antigua, MD [address] [place], [place], [place], [address] [address], [place], [place], [place], [address] Phone: [phone]  Fax: [phone]  Primary Care Physician:  Denyce Robert, FNP   Pre-Procedure History & Physical: HPI:  Alexander Kennedy is a 50 y.o. male is here for a colonoscopy.   Past Medical History:  Diagnosis Date   COPD (chronic obstructive pulmonary disease) (HCC)    GERD (gastroesophageal reflux disease)    RARE   Hypertension    Inguinal hernia, bilateral    Pneumonia    Psoriasis    Sleep apnea    USES CPAP   Tremors of nervous system     Past Surgical History:  Procedure Laterality Date   BURR HOLE W/ STEREOTACTIC INSERTION OF DBS LEADS / INTRAOP MICROELECTRODE RECORDING     KNEE SURGERY Left    SHOULDER ARTHROSCOPY WITH OPEN ROTATOR CUFF REPAIR AND DISTAL CLAVICLE ACROMINECTOMY Right 10/17/2019   Procedure: SHOULDER ARTHROSCOPY WITH OPEN ROTATOR CUFF REPAIR AND biceps tenodysis;  Surgeon: Thornton Park, MD;  Location: [facility] ORS;  Service: Orthopedics;  Laterality: Right;    Prior to Admission medications   Medication Sig Start Date End Date Taking? Authorizing Provider  amLODipine (NORVASC) 5 MG tablet Take 5 mg by mouth every evening. 09/16/19  Yes [provider]  meloxicam (MOBIC) 15 MG tablet Take 15 mg by mouth every evening. 09/14/19  Yes [provider]  methocarbamol (ROBAXIN) 500 MG tablet Take 500 mg by mouth 2 (two) times daily as needed for spasms. 09/14/19  Yes [provider]  polyethylene glycol powder (GLYCOLAX/MIRALAX) 17 GM/SCOOP powder Take 17 g by mouth daily. 08/20/20  Yes Geral Coker, Lennette Bihari, MD  SKYRIZI, 150 MG DOSE, 75 MG/0.83ML PSKT Inject 75 mg into the skin every 3 (three) months. 07/08/19  Yes [provider]  traZODone (DESYREL) 100 MG tablet Take 100 mg by mouth at bedtime. 09/16/19  Yes [provider]  albuterol (PROVENTIL HFA;VENTOLIN HFA) 108 (90 Base)  MCG/ACT inhaler Inhale 1-2 puffs into the lungs every 6 (six) hours as needed for wheezing or shortness of breath. Patient taking differently: Inhale 2 puffs into the lungs every 6 (six) hours as needed for wheezing or shortness of breath. 01/22/17   Fransico Meadow, PA-C  ondansetron (ZOFRAN) 4 MG tablet Take 1 tablet (4 mg total) by mouth every 8 (eight) hours as needed for nausea or vomiting. 10/17/19   Thornton Park, MD  oxyCODONE (OXY IR/ROXICODONE) 5 MG immediate release tablet Take 1 tablet (5 mg total) by mouth every 4 (four) hours as needed. 10/17/19   Thornton Park, MD  SYMBICORT 80-4.5 MCG/ACT inhaler Inhale 2 puffs into the lungs in the morning and at bedtime. 10/07/19   [provider]    Allergies as of 08/21/2020 - Review Complete 08/20/2020  Allergen Reaction Noted   Penicillins Shortness Of Breath and Other (See Comments) 03/08/2016    Family History  Problem Relation Age of Onset   Congestive Heart Failure Father    Colon cancer Maternal Uncle     Social History   Socioeconomic History   Marital status: Married    Spouse name: Not on file   Number of children: Not on file   Years of education: Not on file   Highest education level: Not on file  Occupational History   Not on file  Tobacco Use   Smoking status: Former    Packs/day: 1.00    Years: 30.00    Pack  years: 30.00    Types: Cigarettes    Quit date: 10/03/2019    Years since quitting: 0.9   Smokeless tobacco: Never  Vaping Use   Vaping Use: Never used  Substance and Sexual Activity   Alcohol use: No   Drug use: No   Sexual activity: Not on file  Other Topics Concern   Not on file  Social History Narrative   Patient lives at home alone.    Patient is right handed.    Social Determinants of Health   Financial Resource Strain: Not on file  Food Insecurity: Not on file  Transportation Needs: Not on file  Physical Activity: Not on file  Stress: Not on file  Social Connections: Not  on file  Intimate Partner Violence: Not on file    Review of Systems: See HPI, otherwise negative ROS  Physical Exam: Constitutional: General:   Alert,  Well-developed, well-nourished, pleasant and cooperative in NAD BP (!) 135/104   Pulse 83   Temp (!) 96.4 F (35.8 C) (Temporal)   Resp 20   Ht 5\' 7"  (1.702 m)   Wt 93.2 kg   SpO2 95%   BMI 32.17 kg/m   Head: Normocephalic, atraumatic.   Eyes:  Sclera clear, no icterus.   Conjunctiva pink.   Mouth:  No deformity or lesions, oropharynx pink & moist.  Neck:  Supple, trachea midline  Respiratory: Normal respiratory effort  Gastrointestinal:  Soft, non-tender and non-distended without masses, hepatosplenomegaly or hernias noted.  No guarding or rebound tenderness.     Cardiac: No clubbing or edema.  No cyanosis. Normal posterior tibial pedal pulses noted.  Lymphatic:  No significant cervical adenopathy.  Psych:  Alert and cooperative. Normal mood and affect.  Musculoskeletal:   Symmetrical without gross deformities. 5/5 Lower extremity strength bilaterally.  Skin: Warm. Intact without significant lesions or rashes. No jaundice.  Neurologic:  Face symmetrical, tongue midline, Normal sensation to touch;  grossly normal neurologically.  Psych:  Alert and oriented x3, Alert and cooperative. Normal mood and affect.  Impression/Plan: Alexander Kennedy is here for a colonoscopy to be performed for average risk screening.  Risks, benefits, limitations, and alternatives regarding  colonoscopy have been reviewed with the patient.  Questions have been answered.  All parties agreeable.   Virgel Manifold, MD  08/27/2020, 8:05 AM

## 2020-08-27 NOTE — Transfer of Care (Signed)
Immediate Anesthesia Transfer of Care Note  Patient: [name]  Procedure(s) Performed: COLONOSCOPY WITH PROPOFOL  Patient Location: PACU  On Endo Unit  Anesthesia Type:General  Level of Consciousness: drowsy  Airway & Oxygen Therapy: Patient Spontanous Breathing  Post-op Assessment: Report given to RN and Post -op Vital signs reviewed and stable  Post vital signs: Reviewed and stable  Last Vitals:  Vitals Value Taken Time  BP 114/79 08/27/20 0848  Temp 35.9 C 08/27/20 0846  Pulse 81 08/27/20 0849  Resp 22 08/27/20 0849  SpO2 96 % 08/27/20 0849  Vitals shown include unvalidated device data.  Last Pain:  Vitals:   08/27/20 0846  TempSrc: Temporal  PainSc: Asleep         Complications: No notable events documented.

## 2020-08-28 ENCOUNTER — Encounter: Payer: Self-pay | Admitting: Gastroenterology

## 2020-08-28 LAB — SURGICAL PATHOLOGY

## 2020-09-01 ENCOUNTER — Encounter: Payer: Self-pay | Admitting: Internal Medicine

## 2020-09-16 ENCOUNTER — Other Ambulatory Visit: Payer: Self-pay

## 2020-09-16 ENCOUNTER — Ambulatory Visit (INDEPENDENT_AMBULATORY_CARE_PROVIDER_SITE_OTHER): Payer: Medicaid Other | Admitting: Dermatology

## 2020-09-16 DIAGNOSIS — L409 Psoriasis, unspecified: Secondary | ICD-10-CM | POA: Diagnosis not present

## 2020-09-16 DIAGNOSIS — L4050 Arthropathic psoriasis, unspecified: Secondary | ICD-10-CM | POA: Diagnosis not present

## 2020-09-16 NOTE — Progress Notes (Signed)
   New Patient Visit  Subjective  Alexander Kennedy is a 50 y.o. male who presents for the following: Psoriasis (Patient moved from [place] about a year ago and needs a dermatologist to continue [facility] treatment for psoriasis. He states his skin has been clear but is still having significant joint pain. In the past he used Humira for about 4 years and R[place] Nail for about a year and half.). Patient's previous dermatologist was [facility] (Dr. Glennon Kennedy).   The following portions of the chart were reviewed this encounter and updated as appropriate:   Tobacco  Allergies  Meds  Problems  Med Hx  Surg Hx  Fam Hx     Review of Systems:  No other skin or systemic complaints except as noted in HPI or Assessment and Plan.  Objective  Well appearing patient in no apparent distress; mood and affect are within normal limits.  A focused examination was performed including the face, trunk, and extremities. Relevant physical exam findings are noted in the Assessment and Plan.  Trunk, extremities Clear.  Assessment & Plan  Psoriasis Trunk, extremities With psoriatic arthritis -  BSA 0% on treatment with Skyrizi,  but likely a 20% BSA (before treatment) from patient description. Patient c/o joint pain especially in the knees, and some in the elbows.  Psoriasis is a chronic non-curable, but treatable genetic/hereditary disease that may have other systemic features affecting other organ systems such as joints (Psoriatic Arthritis). It is associated with an increased risk of inflammatory bowel disease, heart disease, non-alcoholic fatty liver disease, and depression.    Recommend referral to rheumatologist since patient is still experiencing joint pains to r/o Psa vs RA vs OA.   If + for PsA recommend switching to Taltz or Cosentyx.   QuantiFERON-TB Gold Plus - Trunk, extremities  No follow-ups on file.  Alexander Kennedy, CMA, am acting as scribe for Alexander Ser, MD . Documentation: I have  reviewed the above documentation for accuracy and completeness, and I agree with the above.  Alexander Ser, MD

## 2020-09-16 NOTE — Patient Instructions (Signed)

## 2020-09-17 ENCOUNTER — Encounter: Payer: Self-pay | Admitting: Dermatology

## 2020-09-17 MED ORDER — SKYRIZI 150 MG/ML ~~LOC~~ SOSY: 150.0000 mg | PREFILLED_SYRINGE | SUBCUTANEOUS | 0 refills | Status: DC

## 2020-09-24 ENCOUNTER — Ambulatory Visit: Payer: Medicaid Other | Admitting: Gastroenterology

## 2020-10-13 ENCOUNTER — Other Ambulatory Visit: Payer: Self-pay | Admitting: Orthopedic Surgery

## 2020-10-13 ENCOUNTER — Other Ambulatory Visit
Admission: RE | Admit: 2020-10-13 | Discharge: 2020-10-13 | Disposition: A | Discharge status: Home / Self Care | Payer: Medicaid Other | Source: Ambulatory Visit | Attending: Orthopedic Surgery | Admitting: Orthopedic Surgery

## 2020-10-13 ENCOUNTER — Other Ambulatory Visit: Payer: Self-pay

## 2020-10-13 DIAGNOSIS — I10 Essential (primary) hypertension: Secondary | ICD-10-CM

## 2020-10-13 HISTORY — DX: Anxiety disorder, unspecified: F41.9

## 2020-10-13 HISTORY — DX: Essential (primary) hypertension: I10

## 2020-10-13 HISTORY — DX: Personal history of urinary calculi: Z87.442

## 2020-10-13 NOTE — Progress Notes (Signed)
Perioperative Services Pre-Admission/Anesthesia Testing   Date: 10/13/20 Name: Alexander Kennedy MRN:   [id number]  Re: Consideration of preoperative prophylactic antibiotic change   Request sent to: Thornton Park, MD (routed and/or faxed via [facility])  Planned Surgical Procedure(s):   Case: [id number] Date/Time: 10/20/20 0730  Procedure: RIGHT SHOULDER ARTHROSCOPY WITH MINI-OPEN ROTATOR CUFF REPAIR AND DISTAL CLAVICLE ACROMINECTOMY (Right)  Anesthesia type: General  Pre-op diagnosis: Right Shoulder Rotator Cuff Tear  Location: [facility] OR ROOM 02 / [place] [facility]S FOR ANESTHESIA GROUP  Surgeons: Thornton Park, MD   Clinical Notes:  Patient has a documented allergy to PCN  Advising that PCN has caused him to experience SOB and dizziness in the past.   Received cephalosporin with no documented complications CEFAZOLIN received on 10/17/2019.  Screened as appropriate for cephalosporin use during medication reconciliation No immediate angioedema, dysphagia, anaphylaxis symptoms. No severe rash involving mucous membranes or skin necrosis. No hospital admissions related to side effects of PCN/cephalosporin use.  No documented reaction to PCN or cephalosporin in the last 10 years.  Request:  As an evidence based approach to reducing the rate of incidence for post-operative SSI and the development of MDROs, could an agent with narrower coverage for preoperative prophylaxis in this patient's upcoming surgical course be considered?   Currently ordered preoperative prophylactic ABX: clindamycin.   Specifically requesting change to cephalosporin (CEFAZOLIN).   Please communicate decision with me and I will change the orders in Epic as per your direction.   Things to consider: Many patients report that they were "allergic" to PCN earlier in life, however this does not translate into a true lifelong allergy. Patients can lose sensitivity to specific IgE antibodies over time if PCN is avoided (Kleris &  Lugar, 2019).  Up to 10% of the adult population and 15% of hospitalized patients report an allergy to PCN, however clinical studies suggest that 90% of those reporting an allergy can tolerate PCN antibiotics (Kleris & Lugar, 2019).  Cross-sensitivity between PCN and cephalosporins has been documented as being as high as 10%, however this estimation included data believed to have been collected in a setting where there was contamination. Newer data suggests that the prevalence of cross-sensitivity between PCN and cephalosporins is actually estimated to be closer to 1% (Hermanides et al., 2018).   Patients labeled as PCN allergic, whether they are truly allergic or not, have been found to have inferior outcomes in terms of rates of serious infection, and these patients tend to have longer hospital stays ([place], 2019).  Treatment related secondary infections, such as Clostridioides difficile, have been linked to the improper use of broad spectrum antibiotics in patients improperly labeled as PCN allergic (Kleris & Lugar, 2019).  Anaphylaxis from cephalosporins is rare and the evidence suggests that there is no increased risk of an anaphylactic type reaction when cephalosporins are used in a PCN allergic patient (Pichichero, 2006).  Citations: Hermanides J, Lemkes BA, Prins Pearla Dubonnet MW, Terreehorst I. Presumed ?-Lactam Allergy and Cross-reactivity in the Operating Theater: A Practical Approach. Anesthesiology. 2018 Aug;129(2):335-342. doi: 10.1097/ALN.0000000000002252. PMID: [id number].  Kleris, [place]., & Lugar, P. L. (2019). Things We Do For No Reason: Failing to Question a Penicillin Allergy History. Journal of hospital medicine, 14(10), [phone]. Advance online publication. https://www.wallace-middleton.info/  Pichichero, M. E. (2006). Cephalosporins can be prescribed safely for penicillin-allergic patients. Journal of family medicine, 55(2), 106-112. Accessed:  https://cdn.mdedge.com/files/s71f-public/Document/September-2017/5502JFP_AppliedEvidence1.pdf   BHonor Loh MSN, APRN, FNP-C, [facility] [place] [facility] Nurse Practitioner FAX: [phone]  10/13/20 3:46 PM

## 2020-10-13 NOTE — Patient Instructions (Addendum)
Your procedure is scheduled on: 10/20/20 - Tuesday Report to the Registration Desk on the 1st floor of the [facility]. To find out your arrival time, please call [phone] between 1PM - 3PM on: 10/19/20 - MondAY REPORT TO [facility] FOR LABS ON 10/15/20 AT 11:00 AM.  REMEMBER: Instructions that are not followed completely may result in serious medical risk, up to and including death; or upon the discretion of your surgeon and anesthesiologist your surgery may need to be rescheduled.  Do not eat food after midnight the night before surgery.  No gum chewing, lozengers or hard candies.  You may however, drink CLEAR liquids up to 2 hours before you are scheduled to arrive for your surgery. Do not drink anything within 2 hours of your scheduled arrival time.  Clear liquids include: - water  - apple juice without pulp - gatorade (not RED, PURPLE, OR BLUE) - black coffee or tea (Do NOT add milk or creamers to the coffee or tea) Do NOT drink anything that is not on this list.  TAKE THESE MEDICATIONS THE MORNING OF SURGERY WITH A SIP OF WATER: NONE  Use albuterol (PROVENTIL HFA;VENTOLIN HFA) 108 (90 Base) MCG/ACT inhaler on the day of surgery and bring to the hospital.  Stop taking beginning 10/12/20 , one week prior to surgery: Anti-inflammatories (NSAIDS) such as Advil, Aleve, Ibuprofen, Motrin, Naproxen, Naprosyn and Aspirin based products such as Excedrin, Goodys Powder, BC Powder.  Stop ANY OVER THE COUNTER supplements until after surgery.  You may take Tylenol as directed if needed for pain up until the day of surgery.  No Alcohol for 24 hours before or after surgery.  No Smoking including e-cigarettes for 24 hours prior to surgery.  No chewable tobacco products for at least 6 hours prior to surgery.  No nicotine patches on the day of surgery.  Do not use any "recreational" drugs for at least a week prior to your surgery.  Please be advised that the combination of cocaine  and anesthesia may have negative outcomes, up to and including death. If you test positive for cocaine, your surgery will be cancelled.  On the morning of surgery brush your teeth with toothpaste and water, you may rinse your mouth with mouthwash if you wish. Do not swallow any toothpaste or mouthwash.  Do not wear jewelry, make-up, hairpins, clips or nail polish.  Do not wear lotions, powders, or perfumes.   Do not shave body from the neck down 48 hours prior to surgery just in case you cut yourself which could leave a site for infection.  Also, freshly shaved skin may become irritated if using the CHG soap.  Contact lenses, hearing aids and dentures may not be worn into surgery.  Do not bring valuables to the hospital. [facility] is not responsible for any missing/lost belongings or valuables.   Notify your doctor if there is any change in your medical condition (cold, fever, infection).  Wear comfortable clothing (specific to your surgery type) to the hospital.  After surgery, you can help prevent lung complications by doing breathing exercises.  Take deep breaths and cough every 1-2 hours. Your doctor may order a device called an Incentive Spirometer to help you take deep breaths. When coughing or sneezing, hold a pillow firmly against your incision with both hands. This is called "splinting." Doing this helps protect your incision. It also decreases belly discomfort.  If you are being admitted to the hospital overnight, leave your suitcase in the car.  After surgery it may be brought to your room.  If you are being discharged the day of surgery, you will not be allowed to drive home. You will need a responsible adult (18 years or older) to drive you home and stay with you that night.   If you are taking public transportation, you will need to have a responsible adult (18 years or older) with you. Please confirm with your physician that it is acceptable to use public  transportation.   Please call the [facility] Dept. at [phone] if you have any questions about these instructions.  Surgery Visitation Policy:  Patients undergoing a surgery or procedure may have one family member or support person with them as long as that person is not COVID-19 positive or experiencing its symptoms.  That person may remain in the waiting area during the procedure.  Inpatient Visitation:    Visiting hours are 7 a.m. to 8 p.m. Inpatients will be allowed two visitors daily. The visitors may change each day during the patient's stay. No visitors under the age of 92. Any visitor under the age of 73 must be accompanied by an adult. The visitor must pass COVID-19 screenings, use hand sanitizer when entering and exiting the patient's room and wear a mask at all times, including in the patient's room. Patients must also wear a mask when staff or their visitor are in the room. Masking is required regardless of vaccination status.

## 2020-10-15 ENCOUNTER — Encounter: Payer: Self-pay | Admitting: Urgent Care

## 2020-10-15 ENCOUNTER — Other Ambulatory Visit: Payer: Self-pay

## 2020-10-15 ENCOUNTER — Other Ambulatory Visit
Admission: RE | Admit: 2020-10-15 | Discharge: 2020-10-15 | Disposition: A | Discharge status: Home / Self Care | Payer: Medicaid Other | Source: Ambulatory Visit | Attending: Orthopedic Surgery | Admitting: Orthopedic Surgery

## 2020-10-15 DIAGNOSIS — Z01812 Encounter for preprocedural laboratory examination: Secondary | ICD-10-CM | POA: Diagnosis not present

## 2020-10-15 LAB — BASIC METABOLIC PANEL
Anion gap: 7 (ref 5–15)
BUN: 19 mg/dL (ref 6–20)
CO2: 29 mmol/L (ref 22–32)
Calcium: 9 mg/dL (ref 8.9–10.3)
Chloride: 103 mmol/L (ref 98–111)
Creatinine, Ser: 0.94 mg/dL (ref 0.61–1.24)
GFR, Estimated: >60 mL/min (ref >60)
Glucose, Bld: 111 mg/dL — ABNORMAL HIGH (ref 70–99)
Potassium: 3.7 mmol/L (ref 3.5–5.1)
Sodium: 139 mmol/L (ref 135–145)

## 2020-10-15 LAB — CBC
HCT: 37.1 % — ABNORMAL LOW (ref 39.0–52.0)
Hemoglobin: 12.7 g/dL — ABNORMAL LOW (ref 13.0–17.0)
MCH: 28.3 pg (ref 26.0–34.0)
MCHC: 34.2 g/dL (ref 30.0–36.0)
MCV: 82.8 fL (ref 80.0–100.0)
Platelets: 138 10*3/uL — ABNORMAL LOW (ref 150–400)
RBC: 4.48 MIL/uL (ref 4.22–5.81)
RDW: 12.7 % (ref 11.5–15.5)
WBC: 4.7 10*3/uL (ref 4.0–10.5)
nRBC: 0 % (ref 0.0–0.2)

## 2020-10-19 MED ORDER — LACTATED RINGERS IV SOLN: INTRAVENOUS | Status: DC

## 2020-10-19 MED ORDER — CHLORHEXIDINE GLUCONATE CLOTH 2 % EX PADS: 6.0000 | MEDICATED_PAD | Freq: Once | CUTANEOUS | Status: DC

## 2020-10-19 MED ORDER — CHLORHEXIDINE GLUCONATE 0.12 % MT SOLN: 15.0000 mL | Freq: Once | OROMUCOSAL | Status: AC

## 2020-10-19 MED ORDER — FAMOTIDINE 20 MG PO TABS: 20.0000 mg | ORAL_TABLET | Freq: Once | ORAL | Status: AC

## 2020-10-19 MED ORDER — ACETAMINOPHEN 500 MG PO TABS: 1000.0000 mg | ORAL_TABLET | ORAL | Status: AC

## 2020-10-19 MED ORDER — CEFAZOLIN SODIUM-DEXTROSE 2-4 GM/100ML-% IV SOLN
2.0000 g | Freq: Once | INTRAVENOUS | Status: AC
  Administered 2020-10-20: 2 g via INTRAVENOUS

## 2020-10-19 MED ORDER — ORAL CARE MOUTH RINSE: 15.0000 mL | Freq: Once | OROMUCOSAL | Status: AC

## 2020-10-19 NOTE — Progress Notes (Signed)
  Perioperative Services Pre-Admission/Anesthesia Testing     Date: 10/19/20  Name: Leopoldo Colaluca MRN:   [id number]  Re: Change in [place] for upcoming surgery   Case: [id number] Date/Time: 10/20/20 0730   Procedure: RIGHT SHOULDER ARTHROSCOPY WITH MINI-OPEN ROTATOR CUFF REPAIR AND DISTAL CLAVICLE ACROMINECTOMY (Right)   Anesthesia type: General   Pre-op diagnosis: Right Shoulder Rotator Cuff Tear   Location: [facility] OR ROOM 09 / [facility] FOR ANESTHESIA GROUP   Surgeons: Thornton Park, MD     Primary attending surgeon was consulted regarding consideration of therapeutic change in antimicrobial agent being used for preoperative prophylaxis in this patient's upcoming surgical case. Following analysis of the risk versus benefits, Dr. Mack Guise, Lennette Bihari, MD advising that it would be acceptable to discontinue the ordered clindamycin and place an order for cefazolin 2 gm IV on call to the OR. Orders for this patient were amended by me following collaborative conversation with attending surgeon taking into consideration of risk versus benefits of the therapy change.  Honor Loh, MSN, APRN, FNP-C, CEN [facility]  Peri-operative Services Nurse Practitioner Phone: [phone] 10/19/20 1:33 PM

## 2020-10-20 ENCOUNTER — Other Ambulatory Visit: Payer: Self-pay

## 2020-10-20 ENCOUNTER — Ambulatory Visit
Admission: RE | Admit: 2020-10-20 | Discharge: 2020-10-20 | Disposition: A | Discharge status: Home / Self Care | Payer: Medicaid Other | Attending: Orthopedic Surgery | Admitting: Orthopedic Surgery

## 2020-10-20 ENCOUNTER — Ambulatory Visit: Payer: Medicaid Other

## 2020-10-20 ENCOUNTER — Encounter
Admission: RE | Disposition: A | Discharge status: Home / Self Care | Payer: Self-pay | Source: Home / Self Care | Attending: Orthopedic Surgery

## 2020-10-20 ENCOUNTER — Encounter: Payer: Self-pay | Admitting: Orthopedic Surgery

## 2020-10-20 ENCOUNTER — Ambulatory Visit: Payer: Medicaid Other | Admitting: Urgent Care

## 2020-10-20 DIAGNOSIS — Z8616 Personal history of COVID-19: Secondary | ICD-10-CM | POA: Diagnosis not present

## 2020-10-20 DIAGNOSIS — Z419 Encounter for procedure for purposes other than remedying health state, unspecified: Secondary | ICD-10-CM

## 2020-10-20 DIAGNOSIS — Z880 Allergy status to penicillin: Secondary | ICD-10-CM | POA: Insufficient documentation

## 2020-10-20 DIAGNOSIS — M75101 Unspecified rotator cuff tear or rupture of right shoulder, not specified as traumatic: Secondary | ICD-10-CM | POA: Insufficient documentation

## 2020-10-20 HISTORY — PX: SHOULDER ARTHROSCOPY WITH OPEN ROTATOR CUFF REPAIR AND DISTAL CLAVICLE ACROMINECTOMY: SHX5683

## 2020-10-20 SURGERY — SHOULDER ARTHROSCOPY WITH OPEN ROTATOR CUFF REPAIR AND DISTAL CLAVICLE ACROMINECTOMY
Anesthesia: General | Site: Shoulder | Laterality: Right

## 2020-10-20 MED ORDER — MIDAZOLAM HCL 2 MG/2ML IJ SOLN
INTRAMUSCULAR | Status: AC
  Filled 2020-10-20: qty 2

## 2020-10-20 MED ORDER — FAMOTIDINE 20 MG PO TABS
ORAL_TABLET | ORAL | Status: AC
  Administered 2020-10-20: 20 mg via ORAL
  Filled 2020-10-20: qty 1

## 2020-10-20 MED ORDER — ROCURONIUM BROMIDE 100 MG/10ML IV SOLN
INTRAVENOUS | Status: DC | PRN
  Administered 2020-10-20: 10 mg via INTRAVENOUS

## 2020-10-20 MED ORDER — BUPIVACAINE HCL (PF) 0.5 % IJ SOLN
INTRAMUSCULAR | Status: AC
  Filled 2020-10-20: qty 10

## 2020-10-20 MED ORDER — LIDOCAINE HCL (CARDIAC) PF 100 MG/5ML IV SOSY
PREFILLED_SYRINGE | INTRAVENOUS | Status: DC | PRN
  Administered 2020-10-20: 90 mg via INTRAVENOUS

## 2020-10-20 MED ORDER — DEXAMETHASONE SODIUM PHOSPHATE 10 MG/ML IJ SOLN
INTRAMUSCULAR | Status: DC | PRN
  Administered 2020-10-20: 5 mg via INTRAVENOUS

## 2020-10-20 MED ORDER — NEOMYCIN-POLYMYXIN B GU 40-200000 IR SOLN
Status: AC
  Filled 2020-10-20: qty 20

## 2020-10-20 MED ORDER — LACTATED RINGERS IR SOLN
Status: DC | PRN
  Administered 2020-10-20 (×4): 3000 mL

## 2020-10-20 MED ORDER — OXYCODONE HCL 5 MG PO TABS: 5.0000 mg | ORAL_TABLET | Freq: Once | ORAL | Status: DC | PRN

## 2020-10-20 MED ORDER — BUPIVACAINE LIPOSOME 1.3 % IJ SUSP
INTRAMUSCULAR | Status: AC
  Filled 2020-10-20: qty 20

## 2020-10-20 MED ORDER — ACETAMINOPHEN 10 MG/ML IV SOLN: 1000.0000 mg | Freq: Once | INTRAVENOUS | Status: DC | PRN

## 2020-10-20 MED ORDER — NEOMYCIN-POLYMYXIN B GU 40-200000 IR SOLN
Status: DC | PRN
  Administered 2020-10-20: 2 mL

## 2020-10-20 MED ORDER — PROPOFOL 10 MG/ML IV BOLUS
INTRAVENOUS | Status: AC
  Filled 2020-10-20: qty 20

## 2020-10-20 MED ORDER — ONDANSETRON HCL 4 MG PO TABS: 4.0000 mg | ORAL_TABLET | Freq: Three times a day (TID) | ORAL | 0 refills | Status: DC | PRN

## 2020-10-20 MED ORDER — GLYCOPYRROLATE 0.2 MG/ML IJ SOLN
INTRAMUSCULAR | Status: DC | PRN
  Administered 2020-10-20: .2 mg via INTRAVENOUS

## 2020-10-20 MED ORDER — BUPIVACAINE HCL (PF) 0.5 % IJ SOLN
INTRAMUSCULAR | Status: DC | PRN
  Administered 2020-10-20: 10 mL via PERINEURAL

## 2020-10-20 MED ORDER — CHLORHEXIDINE GLUCONATE 0.12 % MT SOLN
OROMUCOSAL | Status: AC
  Administered 2020-10-20: 15 mL via OROMUCOSAL
  Filled 2020-10-20: qty 15

## 2020-10-20 MED ORDER — CEFAZOLIN SODIUM-DEXTROSE 2-4 GM/100ML-% IV SOLN
INTRAVENOUS | Status: AC
  Filled 2020-10-20: qty 100

## 2020-10-20 MED ORDER — LIDOCAINE HCL (PF) 1 % IJ SOLN
INTRAMUSCULAR | Status: AC
  Filled 2020-10-20: qty 5

## 2020-10-20 MED ORDER — BUPIVACAINE LIPOSOME 1.3 % IJ SUSP
INTRAMUSCULAR | Status: DC | PRN
  Administered 2020-10-20: 10 mL via PERINEURAL

## 2020-10-20 MED ORDER — OXYCODONE HCL 5 MG PO TABS: 5.0000 mg | ORAL_TABLET | ORAL | 0 refills | Status: DC | PRN

## 2020-10-20 MED ORDER — FENTANYL CITRATE (PF) 100 MCG/2ML IJ SOLN
INTRAMUSCULAR | Status: AC
  Filled 2020-10-20: qty 2

## 2020-10-20 MED ORDER — LIDOCAINE HCL (PF) 1 % IJ SOLN
INTRAMUSCULAR | Status: AC
  Filled 2020-10-20: qty 30

## 2020-10-20 MED ORDER — ONDANSETRON HCL 4 MG/2ML IJ SOLN
INTRAMUSCULAR | Status: DC | PRN
  Administered 2020-10-20: 4 mg via INTRAVENOUS

## 2020-10-20 MED ORDER — ACETAMINOPHEN 500 MG PO TABS
ORAL_TABLET | ORAL | Status: AC
  Administered 2020-10-20: 1000 mg via ORAL
  Filled 2020-10-20: qty 2

## 2020-10-20 MED ORDER — SUGAMMADEX SODIUM 200 MG/2ML IV SOLN
INTRAVENOUS | Status: DC | PRN
  Administered 2020-10-20: 150 mg via INTRAVENOUS

## 2020-10-20 MED ORDER — ROCURONIUM BROMIDE 10 MG/ML (PF) SYRINGE
PREFILLED_SYRINGE | INTRAVENOUS | Status: AC
  Filled 2020-10-20: qty 10

## 2020-10-20 MED ORDER — LIDOCAINE HCL (PF) 1 % IJ SOLN
INTRAMUSCULAR | Status: DC | PRN
  Administered 2020-10-20: 1 mL via SUBCUTANEOUS

## 2020-10-20 MED ORDER — EPHEDRINE 5 MG/ML INJ
INTRAVENOUS | Status: AC
  Filled 2020-10-20: qty 5

## 2020-10-20 MED ORDER — EPINEPHRINE PF 1 MG/ML IJ SOLN
INTRAMUSCULAR | Status: AC
  Filled 2020-10-20: qty 4

## 2020-10-20 MED ORDER — EPHEDRINE SULFATE 50 MG/ML IJ SOLN
INTRAMUSCULAR | Status: DC | PRN
  Administered 2020-10-20: 10 mg via INTRAVENOUS

## 2020-10-20 MED ORDER — DEXAMETHASONE SODIUM PHOSPHATE 10 MG/ML IJ SOLN
INTRAMUSCULAR | Status: AC
  Filled 2020-10-20: qty 1

## 2020-10-20 MED ORDER — FENTANYL CITRATE (PF) 100 MCG/2ML IJ SOLN
INTRAMUSCULAR | Status: DC | PRN
  Administered 2020-10-20 (×2): 50 ug via INTRAVENOUS

## 2020-10-20 MED ORDER — PROPOFOL 10 MG/ML IV BOLUS
INTRAVENOUS | Status: DC | PRN
  Administered 2020-10-20: 150 mg via INTRAVENOUS

## 2020-10-20 MED ORDER — 0.9 % SODIUM CHLORIDE (POUR BTL) OPTIME
TOPICAL | Status: DC | PRN
  Administered 2020-10-20: 500 mL

## 2020-10-20 MED ORDER — PHENYLEPHRINE HCL (PRESSORS) 10 MG/ML IV SOLN
INTRAVENOUS | Status: AC
  Filled 2020-10-20: qty 1

## 2020-10-20 MED ORDER — PROMETHAZINE HCL 25 MG/ML IJ SOLN: 6.2500 mg | INTRAMUSCULAR | Status: DC | PRN

## 2020-10-20 MED ORDER — LACTATED RINGERS IR SOLN
Status: DC | PRN
  Administered 2020-10-20 (×2): 3000 mL

## 2020-10-20 MED ORDER — OXYCODONE HCL 5 MG/5ML PO SOLN: 5.0000 mg | Freq: Once | ORAL | Status: DC | PRN

## 2020-10-20 MED ORDER — SODIUM CHLORIDE 0.9 % IV SOLN
INTRAVENOUS | Status: DC | PRN
  Administered 2020-10-20: 25 ug/min via INTRAVENOUS

## 2020-10-20 MED ORDER — ONDANSETRON HCL 4 MG/2ML IJ SOLN
INTRAMUSCULAR | Status: AC
  Filled 2020-10-20: qty 2

## 2020-10-20 MED ORDER — BUPIVACAINE HCL (PF) 0.25 % IJ SOLN
INTRAMUSCULAR | Status: AC
  Filled 2020-10-20: qty 30

## 2020-10-20 MED ORDER — LIDOCAINE HCL (PF) 2 % IJ SOLN
INTRAMUSCULAR | Status: AC
  Filled 2020-10-20: qty 5

## 2020-10-20 MED ORDER — PHENYLEPHRINE HCL (PRESSORS) 10 MG/ML IV SOLN
INTRAVENOUS | Status: DC | PRN
  Administered 2020-10-20 (×4): 100 ug via INTRAVENOUS
  Administered 2020-10-20: 200 ug via INTRAVENOUS

## 2020-10-20 MED ORDER — IBUPROFEN 200 MG PO TABS: 600.0000 mg | ORAL_TABLET | Freq: Three times a day (TID) | ORAL | 2 refills | Status: DC | PRN

## 2020-10-20 SURGICAL SUPPLY — 75 items
ADAPTER IRRIG TUBE 2 SPIKE SOL (ADAPTER) ×4 IMPLANT
ADPR TBG 2 SPK PMP STRL ASCP (ADAPTER) ×2
ANCH SUT 5.5 KNTLS PEEK (Orthopedic Implant) ×3 IMPLANT
ANCH SUT Q-FX 2.8 (Anchor) ×2 IMPLANT
ANCHOR ALL-SUT Q-FIX 2.8 (Anchor) ×8 IMPLANT
ANCHOR SUT 5.5 MULTIFIX (Orthopedic Implant) ×6 IMPLANT
ANCHOR SUT 5.5MM MULTIFIX (Orthopedic Implant) IMPLANT
ANCHOR SUT BIOC ST 3X145 (Anchor) IMPLANT
BLADE CLIPPER SURG (BLADE) ×2 IMPLANT
BLADE SHAVER 4.5X7 STR FR (MISCELLANEOUS) ×2 IMPLANT
BUR BR 5.5 WIDE MOUTH (BURR) IMPLANT
CANISTER SUCT LVC 12 LTR MEDI- (MISCELLANEOUS) IMPLANT
CANNULA 5.75X7 CRYSTAL CLEAR (CANNULA) ×2 IMPLANT
CANNULA PARTIAL THREAD 2X7 (CANNULA) IMPLANT
CANNULA TWIST IN 8.25X9CM (CANNULA) ×2 IMPLANT
CONNECTOR PERFECT PASSER (CONNECTOR) ×2 IMPLANT
COOLER POLAR GLACIER W/PUMP (MISCELLANEOUS) ×2 IMPLANT
DEVICE SUCT BLK HOLE OR FLOOR (MISCELLANEOUS) ×4 IMPLANT
DRAPE 3/4 80X56 (DRAPES) ×4 IMPLANT
DRAPE IMP U-DRAPE 54X76 (DRAPES) ×4 IMPLANT
DRAPE INCISE IOBAN 66X45 STRL (DRAPES) ×2 IMPLANT
DRAPE U-SHAPE 47X51 STRL (DRAPES) ×2 IMPLANT
DURAPREP 26ML APPLICATOR (WOUND CARE) ×6 IMPLANT
ELECT REM PT RETURN 9FT ADLT (ELECTROSURGICAL) ×2
ELECTRODE REM PT RTRN 9FT ADLT (ELECTROSURGICAL) ×1 IMPLANT
GAUZE SPONGE 4X4 12PLY STRL (GAUZE/BANDAGES/DRESSINGS) ×2 IMPLANT
GAUZE XEROFORM 1X8 LF (GAUZE/BANDAGES/DRESSINGS) ×2 IMPLANT
GLOVE SURG ORTHO LTX SZ9 (GLOVE) ×6 IMPLANT
GLOVE SURG UNDER POLY LF SZ9 (GLOVE) ×2 IMPLANT
GOWN STRL REUS TWL 2XL XL LVL4 (GOWN DISPOSABLE) ×2 IMPLANT
GOWN STRL REUS W/ TWL LRG LVL3 (GOWN DISPOSABLE) ×1 IMPLANT
GOWN STRL REUS W/TWL LRG LVL3 (GOWN DISPOSABLE) ×2
IV LACTATED RINGER IRRG 3000ML (IV SOLUTION) ×12
IV LR IRRIG 3000ML ARTHROMATIC (IV SOLUTION) ×6 IMPLANT
KIT STABILIZATION SHOULDER (MISCELLANEOUS) ×2 IMPLANT
KIT SUTURE 2.8 Q-FIX DISP (MISCELLANEOUS) ×2 IMPLANT
KIT SUTURETAK 3.0 INSERT PERC (KITS) IMPLANT
KIT TURNOVER KIT A (KITS) ×2 IMPLANT
MANIFOLD NEPTUNE II (INSTRUMENTS) ×2 IMPLANT
MASK FACE SPIDER DISP (MASK) ×2 IMPLANT
MAT ABSORB  FLUID 56X50 GRAY (MISCELLANEOUS) ×2
MAT ABSORB FLUID 56X50 GRAY (MISCELLANEOUS) ×2 IMPLANT
NDL SAFETY ECLIPSE 18X1.5 (NEEDLE) ×1 IMPLANT
NEEDLE HYPO 18GX1.5 SHARP (NEEDLE) ×2
NEEDLE HYPO 22GX1.5 SAFETY (NEEDLE) ×2 IMPLANT
NS IRRIG 500ML POUR BTL (IV SOLUTION) IMPLANT
PACK ARTHROSCOPY SHOULDER (MISCELLANEOUS) ×2 IMPLANT
PAD ARMBOARD 7.5X6 YLW CONV (MISCELLANEOUS) ×8 IMPLANT
PAD WRAPON POLAR SHDR XLG (MISCELLANEOUS) ×1 IMPLANT
PASSER SUT FIRSTPASS SELF (INSTRUMENTS) ×2 IMPLANT
SHAVER BLADE BONE CUTTER  5.5 (BLADE) ×1
SHAVER BLADE BONE CUTTER 5.5 (BLADE) ×1 IMPLANT
SPONGE T-LAP 18X18 ~~LOC~~+RFID (SPONGE) ×2 IMPLANT
STRIP CLOSURE SKIN 1/2X4 (GAUZE/BANDAGES/DRESSINGS) ×2 IMPLANT
SUT ETHILON 4-0 (SUTURE) ×2
SUT ETHILON 4-0 FS2 18XMFL BLK (SUTURE) ×1
SUT LASSO 90 DEG SD STR (SUTURE) IMPLANT
SUT MNCRL 4-0 (SUTURE) ×2
SUT MNCRL 4-0 27XMFL (SUTURE) ×1
SUT PDS AB 0 CT1 27 (SUTURE) ×2 IMPLANT
SUT PERFECTPASSER WHITE CART (SUTURE) ×8 IMPLANT
SUT SMART STITCH CARTRIDGE (SUTURE) ×6 IMPLANT
SUT ULTRABRAID 2 COBRAID 38 (SUTURE) IMPLANT
SUT VIC AB 0 CT1 36 (SUTURE) ×2 IMPLANT
SUT VIC AB 2-0 CT2 27 (SUTURE) ×2 IMPLANT
SUTURE ETHLN 4-0 FS2 18XMF BLK (SUTURE) ×1 IMPLANT
SUTURE MNCRL 4-0 27XMF (SUTURE) ×1 IMPLANT
SYR 10ML LL (SYRINGE) ×2 IMPLANT
TAPE MICROFOAM 4IN (TAPE) ×2 IMPLANT
TUBING CONNECTING 10 (TUBING) ×2 IMPLANT
TUBING INFLOW SET DBFLO PUMP (TUBING) ×2 IMPLANT
TUBING OUTFLOW SET DBLFO PUMP (TUBING) ×2 IMPLANT
WAND WEREWOLF FLOW 90D (MISCELLANEOUS) ×2 IMPLANT
WATER STERILE IRR 500ML POUR (IV SOLUTION) ×2 IMPLANT
WRAPON POLAR PAD SHDR XLG (MISCELLANEOUS) ×2

## 2020-10-20 NOTE — Anesthesia Postprocedure Evaluation (Addendum)
Anesthesia Post Note  Patient: Alexander Kennedy  Procedure(s) Performed: RIGHT SHOULDER ARTHROSCOPY WITH MINI-OPEN ROTATOR CUFF REPAIR AND DISTAL CLAVICLE ACROMINECTOMY (Right: Shoulder)  Patient location during evaluation: PACU Anesthesia Type: General Level of consciousness: awake and alert Pain management: pain level controlled Vital Signs Assessment: post-procedure vital signs reviewed and stable Respiratory status: spontaneous breathing, nonlabored ventilation and respiratory function stable Cardiovascular status: blood pressure returned to baseline and stable Postop Assessment: no apparent nausea or vomiting Anesthetic complications: no   No notable events documented.   Last Vitals:  Vitals:   10/20/20 1133 10/20/20 1200  BP: 109/75 114/77  Pulse: 75 75  Resp: 15 16  Temp: 36.4 C   SpO2: 96% 98%    Last Pain:  Vitals:   10/20/20 1200  TempSrc:   PainSc: 0-No pain                 Iran Ouch

## 2020-10-20 NOTE — Op Note (Signed)
10/20/2020  10:55 AM  PATIENT:  Alexander Kennedy  50 y.o. male  PRE-OPERATIVE DIAGNOSIS:  Right Shoulder Rotator Cuff Tear  POST-OPERATIVE DIAGNOSIS: Right shoulder tear of the subscapularis and supraspinatus  PROCEDURE: Right shoulder arthroscopic lysis of adhesions anterior shoulder and revision subscapularis repair and revision mini-open repair of the supraspinatus  SURGEON:  Surgeon(s) and Role:    Thornton Park, MD - Primary  ANESTHESIA:   general and paracervical block   PREOPERATIVE INDICATIONS:  Jabe Koller is a  50 y.o. male with a diagnosis of persistent pain in the right shoulder after a rotator cuff repair last August.  CT arthrogram suggested a small tear following the supraspinatus.  Patient failed nonoperative management and wished possible surgical fixation  The risks benefits and alternatives were discussed with the patient preoperatively including but not limited to the risks of infection, bleeding, nerve injury, persistent pain or weakness, shoulder stiffness/arthrofibrosis, failure of the repair, re-tear of the rotator cuff and the need for further surgery. Medical risks include DVT and pulmonary embolism, myocardial infarction, stroke, pneumonia, respiratory failure and death. Patient understood these risks and wished to proceed.  OPERATIVE IMPLANTS: Earl MultiFix anchors x 3 & [employer]hew Q Fix anchors x 2  OPERATIVE FINDINGS: Partial tear of the subscapularis involving the superior fibers.  High-grade partial-thickness tear involving the supraspinatus/infraspinatus.  OPERATIVE PROCEDURE: The patient was met in the preoperative area. The right shoulder was signed with the word yes and my initials according the hospital's correct site of surgery protocol.   A pre-op history and physical was performed at the bedside.  Patient underwent an interscalene block with Exparel by the anesthesia service.  Patient was brought to the operating room where he  underwent general endotracheal intubation.  The patient was placed in a beachchair position.  A spider arm positioner was used for this case.  Examination under anesthesia revealed no loss of passive range of motion or instability with load shift testing. The patient had a negative sulcus sign.  Patient was prepped and draped in a sterile fashion. A timeout was performed to verify the patient's name, date of birth, medical record number, correct site of surgery and correct procedure to be performed there was also used to verify the patient received antibiotics that all appropriate instruments, implants and radiographs studies were available in the room. Once all in attendance were in agreement case began.  Patient received ancef 2 grams IV for pre-op antibiotics.  Bony landmarks were drawn out with a surgical marker along with proposed arthroscopy incisions.  An 11 blade was used to establish a posterior portal through which the arthroscope was placed in the glenohumeral joint. A full diagnostic examination of the shoulder was performed.  Patient was found to have a partial tear of the subscapularis with slight retraction.  Patient also had a near full-thickness partial posterior supraspinatus extending into the infraspinatus.  Arthroscopic lysis of adhesions using a 90 degree wand and whisker shaver blade.  The subscapularis tendon was fixed arthroscopically using the anterior portal.  Two perfect Pass sutures were place in the superior subscapularis tendon.   The lesser tubersity was debrided using a 4.5 mm resector shaver blade.  Crocker Multifix anchors were used to repair the upper subscapularis to the lesser tuberosity.  A 0 PDS suture was placed through the supraspinatus tear using an 18G spinal needle for localization.  The arthroscope was then placed in the subacromial space.  Extensive bursitis was encountered  and debrided using a 4-0 resector shaver blade and a 90 ArthroCare wand from  a lateral portal which was established under direct visualization using an 18-gauge spinal needle. The 0 PDS suture was identified and the rotator cuff tear identified and debrided using a whisker shaver blade.  A single Perfect Pass suture was placed in the lateral border of the rotator cuff tear. All arthroscopic instruments were then removed and the mini-open portion of the procedure began.  A saber-type incision was made along the lateral border of the acromion. The deltoid muscle was identified and split in line with its fibers which allowed visualization of the rotator cuff. The Perfect Pass suture previously placed in the lateral border of the rotator cuff were brought out through the deltoid split.  Two Smith and [employer] Fix anchors were placed at the articular margin of the humeral head with the greater tuberosity. The suture limbs of the Q Fix anchors were passed medially through the rotator cuff using a First Pass suture passer.   Two additional perfect pass sutures were placed in the lateral border of the rotator cuff tear.  The three Perfect Pass sutures were then anchored to the greater tuberosity footprint using a single [employer] Multifix anchor. The sutures passed through the Multifix anchor and were then tensioned to allow reduction of the rotator cuff to the greater tuberosity footprint. The medial row repair was then performed using the Q fix sutures, which were tied down using an arthroscopic knot tying technique.  Arthroscopic images of the repair were taken with the arthroscope both externally and from inside the glenohumeral joint.  All incisions were copiously irrigated. The deltoid fascia was repaired using a 0 Vicryl suture.  The subcutaneous tissue of all incisions were closed with a 2-0 Vicryl. Skin closure for the arthroscopic incisions was performed with 4-0 nylon. The skin edges of the saber incision was approximated with a running 4-0 undyed Monocryl.  A dry sterile  dressing was applied.  The patient was placed in an abduction sling and a Polar Care was applied to the shoulder.  All sharp, sponge and it instrument counts were correct at the conclusion of the case. I was scrubbed and present for the entire case. I spoke with the patient's wife by phone postoperatively to let her know the case had been performed without complication and the patient was stable in recovery room.

## 2020-10-20 NOTE — Anesthesia Preprocedure Evaluation (Addendum)
Anesthesia Evaluation  Patient identified by MRN, date of birth, ID band Patient awake    Reviewed: Allergy & Precautions, NPO status , Patient's Chart, lab work & pertinent test results  History of Anesthesia Complications Negative for: history of anesthetic complications  Airway Mallampati: II  TM Distance: >3 FB Neck ROM: Full    Dental no notable dental hx. (+) Dental Advisory Given, Missing,    Pulmonary sleep apnea and Continuous Positive Airway Pressure Ventilation , COPD,  COPD inhaler, Current Smoker and Patient abstained from smoking., former smoker,    Pulmonary exam normal breath sounds clear to auscultation       Cardiovascular Exercise Tolerance: Good METShypertension, (-) CAD and (-) Past MI (-) dysrhythmias  Rhythm:Regular Rate:Normal - Systolic murmurs    Neuro/Psych Essential tremor s/p DBS implantation, left sided leads negative psych ROS   GI/Hepatic GERD  Controlled,(+)     (-) substance abuse  ,   Endo/Other  neg diabetes  Renal/GU negative Renal ROS     Musculoskeletal   Abdominal   Peds  Hematology   Anesthesia Other Findings Past Medical History: No date: COPD (chronic obstructive pulmonary disease) (HCC) No date: GERD (gastroesophageal reflux disease)     Comment:  RARE No date: Hypertension No date: Inguinal hernia, bilateral No date: Pneumonia No date: Psoriasis No date: Sleep apnea     Comment:  USES CPAP No date: Tremors of nervous system  Reproductive/Obstetrics                            Anesthesia Physical  Anesthesia Plan  ASA: III  Anesthesia Plan: General   Post-op Pain Management:  Regional for Post-op pain   Induction: Intravenous  PONV Risk Score and Plan: 2 and Ondansetron, Dexamethasone and Midazolam  Airway Management Planned: Oral ETT  Additional Equipment: None  Intra-op Plan:   Post-operative Plan: Extubation in  OR  Informed Consent: I have reviewed the patients History and Physical, chart, labs and discussed the procedure including the risks, benefits and alternatives for the proposed anesthesia with the patient or authorized representative who has indicated his/her understanding and acceptance.     Dental advisory given  Plan Discussed with: CRNA and Surgeon  Anesthesia Plan Comments: (See prior notes regarding patient's DBS stimulator.  Discussed r/b/a of interscalene block, including elective nature. Risks discussed: - Rare: bleeding, infection, nerve damage - shortness of breath from hemidiaphragmatic paralysis, potentially increased in patients with respiratory disease. However this patient's respiratory disease seems controlled and not severe. - unilateral horner's syndrome - poor/non-working blocks Patient understands and agrees.   Discussed risks of anesthesia with patient, including PONV, sore throat, lip/dental damage. Rare risks discussed as well, such as cardiorespiratory and neurological sequelae. Patient understands.)        Anesthesia Quick Evaluation

## 2020-10-20 NOTE — Anesthesia Procedure Notes (Signed)
Procedure Name: Intubation Date/Time: 10/20/2020 7:56 AM Performed by: Rona Ravens, CRNA Pre-anesthesia Checklist: Patient identified, Patient being monitored, Timeout performed, Emergency Drugs available and Suction available Patient Re-evaluated:Patient Re-evaluated prior to induction Oxygen Delivery Method: Circle system utilized Preoxygenation: Pre-oxygenation with 100% oxygen Induction Type: IV induction Ventilation: Mask ventilation without difficulty Laryngoscope Size: McGraph and 4 Grade View: Grade I Tube type: Oral Tube size: 7.5 mm Number of attempts: 1 Airway Equipment and Method: Stylet Placement Confirmation: ETT inserted through vocal cords under direct vision, positive ETCO2 and breath sounds checked- equal and bilateral Secured at: 21 cm Tube secured with: Tape Dental Injury: Teeth and Oropharynx as per pre-operative assessment

## 2020-10-20 NOTE — Transfer of Care (Signed)
Immediate Anesthesia Transfer of Care Note  Patient: Alexander Kennedy  Procedure(s) Performed: RIGHT SHOULDER ARTHROSCOPY WITH MINI-OPEN ROTATOR CUFF REPAIR AND DISTAL CLAVICLE ACROMINECTOMY (Right: Shoulder)  Patient Location: PACU  Anesthesia Type:General  Level of Consciousness: awake, alert  and oriented  Airway & Oxygen Therapy: Patient Spontanous Breathing and Patient connected to face mask oxygen  Post-op Assessment: Report given to RN and Post -op Vital signs reviewed and stable  Post vital signs: Reviewed and stable  Last Vitals:  Vitals Value Taken Time  BP    Temp    Pulse    Resp    SpO2      Last Pain:  Vitals:   10/20/20 0623  TempSrc: Temporal  PainSc: 3          Complications: No notable events documented.

## 2020-10-20 NOTE — Anesthesia Procedure Notes (Signed)
Anesthesia Regional Block: Interscalene brachial plexus block   Pre-Anesthetic Checklist: , timeout performed,  Correct Patient, Correct Site, Correct Laterality,  Correct Procedure, Correct Position, site marked,  Risks and benefits discussed,  Surgical consent,  Pre-op evaluation,  At surgeon's request and post-op pain management  Laterality: Upper and Right  Prep: chloraprep       Needles:  Injection technique: Single-shot  Needle Type: Stimiplex     Needle Length: 5cm  Needle Gauge: 24     Additional Needles:   Procedures:,,,, ultrasound used (permanent image in chart),,    Narrative:  Start time: 10/20/2020 7:29 AM End time: 10/20/2020 7:34 AM Injection made incrementally with aspirations every 20 mL.  Performed by: Personally  Anesthesiologist: Iran Ouch, MD  Additional Notes: Patient consented for risk and benefits of nerve block including but not limited to nerve damage, failed block, bleeding and infection.  Patient voiced understanding.  Functioning IV was confirmed and monitors were applied.  Timeout done prior to procedure and prior to any sedation being given to the patient.  Patient confirmed procedure site prior to any sedation given to the patient.  A 26m 22ga Stimuplex needle was used. Sterile prep,hand hygiene and sterile gloves were used.  Minimal sedation used for procedure.  No paresthesia endorsed by patient during the procedure.  Negative aspiration and negative test dose prior to incremental administration of local anesthetic. The patient tolerated the procedure well with no immediate complications.

## 2020-10-20 NOTE — Discharge Instructions (Signed)

## 2020-10-20 NOTE — H&P (Signed)
PREOPERATIVE H&P  Chief Complaint: Right Shoulder Rotator Cuff Tear  HPI: Alexander Kennedy is a 50 y.o. male who presents for preoperative history and physical with a diagnosis of Right Shoulder Rotator Cuff Tear suggested by CT angiogram.  Symptoms of pain, weakness and limited ROM are significantly impairing activities of daily living.  Patient had previous rotator cuff repair with me approximately a year ago.  He had a supraspinatus and subscapularis appeared.  CT scan showed a small tear in the supraspinatus.  Patient has failed nonoperative management and wished to proceed with surgical fixation of the rotator cuff tear.    Past Medical History:  Diagnosis Date   Anxiety    COPD (chronic obstructive pulmonary disease) ([place])    COVID-19 2020   GERD (gastroesophageal reflux disease)    RARE   History of kidney stones    HTN (hypertension) 10/13/2020   DIET CONTROLLED- NO MEDICATION   Hypertension    Inguinal hernia, bilateral    Pneumonia    Psoriasis    Sleep apnea    DOES NOT USE CPAP   Tremors of nervous system    Past Surgical History:  Procedure Laterality Date   BURR HOLE W/ STEREOTACTIC INSERTION OF DBS LEADS / INTRAOP MICROELECTRODE RECORDING     COLONOSCOPY WITH PROPOFOL N/A 08/27/2020   Procedure: COLONOSCOPY WITH PROPOFOL;  Surgeon: Virgel Manifold, MD;  Location: [facility] ENDOSCOPY;  Service: Endoscopy;  Laterality: N/A;   HAND ARTHROSCOPY Left    KNEE SURGERY Left    SHOULDER ARTHROSCOPY WITH OPEN ROTATOR CUFF REPAIR AND DISTAL CLAVICLE ACROMINECTOMY Right 10/17/2019   Procedure: SHOULDER ARTHROSCOPY WITH OPEN ROTATOR CUFF REPAIR AND biceps tenodysis;  Surgeon: Thornton Park, MD;  Location: [facility] ORS;  Service: Orthopedics;  Laterality: Right;   TRIGGER FINFER RELEASE     ON 2 FINGERS, AND EXPLORATORY SCOPING   Social History   Socioeconomic History   Marital status: Married    Spouse name: Not on file   Number of children: 1   Years of education: Not on  file   Highest education level: Not on file  Occupational History   Not on file  Tobacco Use   Smoking status: Former    Packs/day: 1.00    Years: 30.00    Pack years: 30.00    Types: Cigarettes    Quit date: 10/03/2019    Years since quitting: 1.0   Smokeless tobacco: Never  Vaping Use   Vaping Use: Every day   Start date: 10/11/2020   Substances: Nicotine  Substance and Sexual Activity   Alcohol use: No   Drug use: No   Sexual activity: Not on file  Other Topics Concern   Not on file  Social History Narrative   Patient lives at home [place]   Patient is right handed.    Social Determinants of Health   Financial Resource Strain: Not on file  Food Insecurity: Not on file  Transportation Needs: Not on file  Physical Activity: Not on file  Stress: Not on file  Social Connections: Not on file   Family History  Problem Relation Age of Onset   Congestive Heart Failure Father    Colon cancer Maternal Uncle    Allergies  Allergen Reactions   Penicillins Shortness Of Breath    Other: Dizziness Tolerated 1st generation cephalosporin (CEFAZOLIN) on 10/17/2019 without ADRs. PCN reaction causing immediate rash, facial/tongue/throat swelling, SOB or lightheadedness with hypotension: Yes PCN reaction causing severe rash involving mucus membranes  or skin necrosis: No PCN reaction that required hospitalization: No PCN reaction occurring within the last 10 years: No If all of the above answers are "NO", then may proceed with Cephalosporin use.    Prior to Admission medications   Medication Sig Start Date End Date Taking? Authorizing Provider  albuterol (PROVENTIL HFA;VENTOLIN HFA) 108 (90 Base) MCG/ACT inhaler Inhale 1-2 puffs into the lungs every 6 (six) hours as needed for wheezing or shortness of breath. Patient taking differently: Inhale 2 puffs into the lungs every 6 (six) hours as needed for wheezing or shortness of breath. 01/22/17  Yes Caryl Ada K, PA-C   hydrOXYzine (ATARAX/VISTARIL) 25 MG tablet Take 25 mg by mouth 3 (three) times daily as needed for anxiety.   Yes [provider]  ibuprofen (ADVIL) 200 MG tablet Take 600 mg by mouth every 8 (eight) hours as needed for moderate pain.   Yes [provider]  traZODone (DESYREL) 100 MG tablet Take 100 mg by mouth at bedtime. 09/16/19  Yes [provider]  polyethylene glycol powder (GLYCOLAX/MIRALAX) 17 GM/SCOOP powder Take 17 g by mouth daily. Patient not taking: No sig reported 08/20/20   Virgel Manifold, MD  Risankizumab-rzaa [facility]) 150 MG/ML SOSY Inject 150 mg into the skin as directed. Every 12 weeks for maintenance. 09/17/20   Ralene Bathe, MD     Positive ROS: All other systems have been reviewed and were otherwise negative with the exception of those mentioned in the HPI and as above.  Physical Exam: General: Alert, no acute distress Cardiovascular: Regular rate and rhythm, no murmurs rubs or gallops.  No pedal edema Respiratory: Clear to auscultation bilaterally, no wheezes rales or rhonchi. No cyanosis, no use of accessory musculature GI: No organomegaly, abdomen is soft and non-tender nondistended with positive bowel sounds. Skin: Skin intact, no lesions within the operative field. Neurologic: Sensation intact distally Psychiatric: Patient is competent for consent with normal mood and affect Lymphatic: No cervical lymphadenopathy  MUSCULOSKELETAL: Right shoulder: Patient can forward to approximately 40 degrees and abduct 120 degrees.  He has external rotation in abduction of approximately 80 degrees and internally rotates approximately 40 to 50 degrees.  He has pain with resisted abduction but no obvious weakness.  He has no weakness to shoulder external rotation or internal rotation.  He has palpable radial pulse and intact sensation light touch and has full digital wrist and elbow range of motion.  Assessment: Right Shoulder Rotator Cuff  Tear  Plan: Plan for Procedure(s): RIGHT SHOULDER ARTHROSCOPY WITH MINI-OPEN ROTATOR CUFF REPAIR AND DISTAL CLAVICLE ACROMINECTOMY  I reviewed the details of the operation as well as the postoperative course with the patient.  Patient is familiar with the postoperative course having been through the surgery before.  Preop history and physical was performed at the bedside.  I marked the right shoulder according hospital's quickset surgery protocol.  I answered all the patient's questions.  I discussed the risks and benefits of surgery. The risks include but are not limited to infection, bleeding, nerve or blood vessel injury, joint stiffness or loss of motion, persistent pain, weakness or instability, re-tear of the rotator cuff, failure of the repair, hardware failure and the need for further surgery. Medical risks include but are not limited to DVT and pulmonary embolism, myocardial infarction, stroke, pneumonia, respiratory failure and death. Patient understood these risks and wished to proceed.     Thornton Park, MD   10/20/2020 7:51 AM

## 2020-11-04 ENCOUNTER — Ambulatory Visit: Payer: Medicaid Other

## 2020-11-24 ENCOUNTER — Ambulatory Visit: Payer: Medicaid Other | Admitting: Gastroenterology

## 2020-12-16 ENCOUNTER — Ambulatory Visit: Payer: Medicaid Other | Admitting: Dermatology

## 2020-12-16 ENCOUNTER — Other Ambulatory Visit: Payer: Self-pay

## 2020-12-16 DIAGNOSIS — L409 Psoriasis, unspecified: Secondary | ICD-10-CM

## 2020-12-16 DIAGNOSIS — Z8616 Personal history of COVID-19: Secondary | ICD-10-CM

## 2020-12-16 DIAGNOSIS — L578 Other skin changes due to chronic exposure to nonionizing radiation: Secondary | ICD-10-CM

## 2020-12-16 MED ORDER — SKYRIZI PEN 150 MG/ML ~~LOC~~ SOAJ: 150.0000 mg | SUBCUTANEOUS | 3 refills | Status: DC

## 2020-12-16 NOTE — Progress Notes (Signed)
   Follow-Up Visit   Subjective  Alexander Kennedy is a 50 y.o. male who presents for the following: Psoriasis ([place], extremities, f/u Orson Ape just approved 1 month ago, so has only had one shot, pt was previously on when he came to our office had last injection 03/22 then came to our office and got approved again and has had 1 injection, we referred pt to rheumatology and he had to cancel b/c he had covid, his new appt is 02/2021 with Dr. Posey Pronto).  The following portions of the chart were reviewed this encounter and updated as appropriate:   Tobacco  Allergies  Meds  Problems  Med Hx  Surg Hx  Fam Hx     Review of Systems:  No other skin or systemic complaints except as noted in HPI or Assessment and Plan.  Objective  Well appearing patient in no apparent distress; mood and affect are within normal limits.  A focused examination was performed including arms. Relevant physical exam findings are noted in the Assessment and Plan.  scalp, arms No active psoriasis today   Assessment & Plan  Psoriasis scalp, arms  Psoriasis - severe on systemic "biologic" treatment injections.  Psoriasis is a chronic non-curable, but treatable genetic/hereditary disease that may have other systemic features affecting other organ systems such as joints (Psoriatic Arthritis).  It is linked with heart disease, inflammatory bowel disease, non-alcoholic fatty liver disease, and depression. Significant skin psoriasis and/or psoriatic arthritis may have significant symptoms and affects activities of daily activity and often benefits from systemic "biologic" injection treatments.  These "biologic" treatments have some potential side effects including immunosuppression and require pre-treatment laboratory screening and periodic laboratory monitoring and periodic in person evaluation and monitoring by the attending dermatologist physician (long term medication management).   Clear today Still has joint pain, pt  scheduled with Dr. Posey Pronto for rheumatolgy 02/2021, may consider changing biologics pending Dr. Ena Dawley findings.  Cont Skyrizi sq injections q 12 wks  Risankizumab-rzaa (SKYRIZI PEN) 150 MG/ML SOAJ - scalp, arms Inject 150 mg into the skin as directed. Every 12 weeks for maintenance.  Related Medications Risankizumab-rzaa (SKYRIZI) 150 MG/ML SOSY Inject 150 mg into the skin as directed. Every 12 weeks for maintenance.  Actinic Damage - chronic, secondary to cumulative UV radiation exposure/sun exposure over time - diffuse scaly erythematous macules with underlying dyspigmentation - Recommend daily broad spectrum sunscreen SPF 30+ to sun-exposed areas, reapply every 2 hours as needed.  - Recommend staying in the shade or wearing long sleeves, sun glasses (UVA+UVB protection) and wide brim hats (4-inch brim around the entire circumference of the hat). - Call for new or changing lesions.   Return in about 6 months (around 06/16/2021) for Psoriasis f/u, TBSE.  I, Othelia Pulling, RMA, am acting as scribe for Sarina Ser, MD . Documentation: I have reviewed the above documentation for accuracy and completeness, and I agree with the above.  Sarina Ser, MD

## 2020-12-16 NOTE — Patient Instructions (Signed)

## 2020-12-17 ENCOUNTER — Encounter: Payer: Self-pay | Admitting: Dermatology

## 2021-02-22 ENCOUNTER — Emergency Department
Admission: EM | Admit: 2021-02-22 | Discharge: 2021-02-22 | Discharge status: Against Medical Advice | Payer: Medicaid Other

## 2021-04-07 ENCOUNTER — Emergency Department
Admission: EM | Admit: 2021-04-07 | Discharge: 2021-04-07 | Disposition: A | Discharge status: Home / Self Care | Payer: Medicaid Other | Attending: Student in an Organized Health Care Education/Training Program | Admitting: Student in an Organized Health Care Education/Training Program

## 2021-04-07 ENCOUNTER — Encounter: Payer: Self-pay | Admitting: Emergency Medicine

## 2021-04-07 ENCOUNTER — Emergency Department: Payer: Medicaid Other

## 2021-04-07 ENCOUNTER — Other Ambulatory Visit: Payer: Self-pay

## 2021-04-07 DIAGNOSIS — N401 Enlarged prostate with lower urinary tract symptoms: Secondary | ICD-10-CM | POA: Insufficient documentation

## 2021-04-07 DIAGNOSIS — N200 Calculus of kidney: Secondary | ICD-10-CM | POA: Diagnosis not present

## 2021-04-07 DIAGNOSIS — N50812 Left testicular pain: Secondary | ICD-10-CM | POA: Insufficient documentation

## 2021-04-07 DIAGNOSIS — N492 Inflammatory disorders of scrotum: Secondary | ICD-10-CM | POA: Insufficient documentation

## 2021-04-07 DIAGNOSIS — R1032 Left lower quadrant pain: Secondary | ICD-10-CM

## 2021-04-07 DIAGNOSIS — I700 Atherosclerosis of aorta: Secondary | ICD-10-CM | POA: Insufficient documentation

## 2021-04-07 LAB — CBC
HCT: 39.7 % (ref 39.0–52.0)
Hemoglobin: 12.8 g/dL — ABNORMAL LOW (ref 13.0–17.0)
MCH: 27.9 pg (ref 26.0–34.0)
MCHC: 32.2 g/dL (ref 30.0–36.0)
MCV: 86.7 fL (ref 80.0–100.0)
Platelets: 109 10*3/uL — ABNORMAL LOW (ref 150–400)
RBC: 4.58 MIL/uL (ref 4.22–5.81)
RDW: 13.4 % (ref 11.5–15.5)
WBC: 4.7 10*3/uL (ref 4.0–10.5)
nRBC: 0 % (ref 0.0–0.2)

## 2021-04-07 LAB — BASIC METABOLIC PANEL
Anion gap: 3 — ABNORMAL LOW (ref 5–15)
BUN: 16 mg/dL (ref 6–20)
CO2: 30 mmol/L (ref 22–32)
Calcium: 8.6 mg/dL — ABNORMAL LOW (ref 8.9–10.3)
Chloride: 104 mmol/L (ref 98–111)
Creatinine, Ser: 1.07 mg/dL (ref 0.61–1.24)
GFR, Estimated: >60 mL/min (ref >60)
Glucose, Bld: 82 mg/dL (ref 70–99)
Potassium: 3.7 mmol/L (ref 3.5–5.1)
Sodium: 137 mmol/L (ref 135–145)

## 2021-04-07 LAB — URINALYSIS, COMPLETE (UACMP) WITH MICROSCOPIC
Bacteria, UA: NONE SEEN
Bilirubin Urine: NEGATIVE
Glucose, UA: NEGATIVE mg/dL
Hgb urine dipstick: NEGATIVE
Ketones, ur: NEGATIVE mg/dL
Leukocytes,Ua: NEGATIVE
Nitrite: NEGATIVE
Protein, ur: NEGATIVE mg/dL
Specific Gravity, Urine: 1.023 (ref 1.005–1.030)
pH: 5 (ref 5.0–8.0)

## 2021-04-07 MED ORDER — OXYCODONE-ACETAMINOPHEN 5-325 MG PO TABS
1.0000 | ORAL_TABLET | Freq: Once | ORAL | Status: AC
  Administered 2021-04-07: 1 via ORAL
  Filled 2021-04-07: qty 1

## 2021-04-07 NOTE — ED Triage Notes (Signed)
Pt comes into the ED via POV c/o left side groin pain that started last night.  Pt denies any swelling to the testes or any bulge present.  Pt states he did notice some redness to the scrotum.  Pt states the pain is very localized on the left where he can point where it is.  Pt admits he has been told in the past that he has a hernia present in that spot.  Pt ambulatory to triage and in NAD at this time with even and unlabored respirations. Pt denies any urinary symptoms.

## 2021-04-07 NOTE — Discharge Instructions (Addendum)
IMPRESSION:  1. No acute ultrasound abnormality to explain left groin pain.  2. Small, nonspecific bilateral hydroceles.  3. Tiny 0.3 cm hypoechoic lesion of the superior pole of the left  testicle, indeterminate. This most likely reflects a tiny  incidental, benign testicular cyst but mass is not strictly  excluded. Recommend follow-up ultrasound in 3-6 months to ensure  stability.     You have been seen in the emergency department for emergency care. It is important that you contact your own doctor, specialist or the closest clinic for follow-up care. Please bring this instruction sheet, all medications and X-ray copies with you when you are seen for follow-up care.  Determining the exact cause for all patients with abdominal pain is extremely difficult in the emergency department. Our primary focus is to rule-out immediate life-threatening diseases. If no immediate source of pain is found the definitive diagnosis frequently needs to be determined over time.Many times your primary care physician can determine the cause by following the symptoms over time. Sometimes, specialist are required such as Gastroenterologists, Gynecologists, Urologists or Surgeons. Please return immediately to the Emergency Department for fever>101, Vomiting or Intractable Pain. You should return to the emergency department or see your primary care provider in 12-24hrs if your pain is no better and sooner if your pain becomes worse.

## 2021-04-07 NOTE — ED Notes (Signed)
Pt to ED for L groin pain that started last night. States per PCP does have hernia in same area and that if it ever started hurting, to go to hospital. States L testicle is slightly larger than R but he believes this may be normal for him.   Denies urinary symptoms and flank pain.   NAD, ambulatory.

## 2021-04-07 NOTE — ED Provider Notes (Signed)
[facility] Provider Note    Event Date/Time   First MD Initiated Contact with Patient 04/07/21 [phone]     (approximate)   History   Groin Pain   HPI  Alexander Kennedy is a 51 y.o. male   with a history of COPD GERD and kidney stones presents to the ER for left groin pain that awoke him from sleep last night now becoming more constant.  Feels like it is primarily in his left testicle.  Denies any flank or abdominal pain.  Also states he has a history of hernia but denies any bulging or swelling.  Denies any dysuria.  No fevers.      Physical Exam   Triage Vital Signs: ED Triage Vitals [04/07/21 0929]  Enc Vitals Group     BP 112/79     Pulse Rate 85     Resp 16     Temp (!) 97.4 F (36.3 C)     Temp Source Oral     SpO2 98 %     Weight 199 lb 15.3 oz (90.7 kg)     Height 5\' 7"  (1.702 m)     Head Circumference      Peak Flow      Pain Score 5     Pain Loc      Pain Edu?      Excl. in [place]?     Most recent vital signs: Vitals:   04/07/21 0929  BP: 112/79  Pulse: 85  Resp: 16  Temp: (!) 97.4 F (36.3 C)  SpO2: 98%     Constitutional: Alert  Eyes: Conjunctivae are normal.  Head: Atraumatic. Nose: No congestion/rhinnorhea. Mouth/Throat: Mucous membranes are moist.   Neck: Painless ROM.  Cardiovascular:   Good peripheral circulation. Respiratory: Normal respiratory effort.  No retractions.  Gastrointestinal: Soft and nontender.  No hernia on exam.  Does have tenderness to palpation of the left testicle posteriorly no mass.  Scrotum nonerythematous.  No swelling Musculoskeletal:  no deformity Neurologic:  MAE spontaneously. No gross focal neurologic deficits are appreciated.  Skin:  Skin is warm, dry and intact. No rash noted. Psychiatric: Mood and affect are normal. Speech and behavior are normal.    ED Results / Procedures / Treatments   Labs (all labs ordered are listed, but only abnormal results are displayed) Labs Reviewed   CBC - Abnormal; Notable for the following components:      Result Value   Hemoglobin 12.8 (*)    Platelets 109 (*)    All other components within normal limits  BASIC METABOLIC PANEL - Abnormal; Notable for the following components:   Calcium 8.6 (*)    Anion gap 3 (*)    All other components within normal limits  URINALYSIS, COMPLETE (UACMP) WITH MICROSCOPIC - Abnormal; Notable for the following components:   Color, Urine YELLOW (*)    APPearance CLEAR (*)    All other components within normal limits     EKG     RADIOLOGY Please see ED Course for my review and interpretation.  I personally reviewed all radiographic images ordered to evaluate for the above acute complaints and reviewed radiology reports and findings.  These findings were personally discussed with the patient.  Please see medical record for radiology report.    PROCEDURES:  Critical Care performed:   Procedures   MEDICATIONS ORDERED IN ED: Medications  oxyCODONE-acetaminophen (PERCOCET/ROXICET) 5-325 MG per tablet 1 tablet (1 tablet Oral Given 04/07/21 1000)  IMPRESSION / MDM / ASSESSMENT AND PLAN / ED COURSE  I reviewed the triage vital signs and the nursing notes.                              Differential diagnosis includes, but is not limited to, stone, hernia, torsion, orchitis, epididymitis, musculoskeletal strain  Patient presenting with symptoms as described above clinically nontoxic-appearing but except above differential order ultrasound. WIll Provide Percocet for pain.   Clinical Course as of 04/07/21 1212  Wed Apr 07, 2021  1035 My review of ultrasound it appears that patient has good blood flow to bilateral testes will await formal radiology report. [PR]  1132 CT imaging on my review does not show any evidence of AAA  orevidence of ureterolithiasis will wait formal radiology report [PR]  1209 CT imaging without acute abnormality.  Discussed findings of ultrasound and CT with  patient.  Given his reassuring work-up as his repeat exam is benign and work-up otherwise reassuring I do believe he is stable and appropriate for outpatient follow-up. [PR]    Clinical Course User Index [PR] Merlyn Lot, MD     FINAL CLINICAL IMPRESSION(S) / ED DIAGNOSES   Final diagnoses:  Left groin pain     Rx / DC Orders   ED Discharge Orders     None        Note:  This document was prepared using Dragon voice recognition software and may include unintentional dictation errors.    Merlyn Lot, MD 04/07/21 1212

## 2021-04-14 ENCOUNTER — Other Ambulatory Visit: Payer: Self-pay | Admitting: Orthopedic Surgery

## 2021-04-14 DIAGNOSIS — M7501 Adhesive capsulitis of right shoulder: Secondary | ICD-10-CM

## 2021-04-28 ENCOUNTER — Inpatient Hospital Stay: Admission: RE | Admit: 2021-04-28 | Payer: Medicaid Other | Source: Ambulatory Visit

## 2021-04-28 ENCOUNTER — Ambulatory Visit: Payer: Medicaid Other

## 2021-05-26 ENCOUNTER — Encounter: Payer: Self-pay | Admitting: Urology

## 2021-05-26 ENCOUNTER — Ambulatory Visit: Payer: Medicaid Other | Admitting: Urology

## 2021-05-26 VITALS — BP 116/81 | HR 105 | Ht 67.0 in | Wt 205.0 lb

## 2021-05-26 DIAGNOSIS — N5082 Scrotal pain: Secondary | ICD-10-CM

## 2021-05-26 DIAGNOSIS — Z87442 Personal history of urinary calculi: Secondary | ICD-10-CM

## 2021-05-26 DIAGNOSIS — N200 Calculus of kidney: Secondary | ICD-10-CM | POA: Diagnosis not present

## 2021-05-26 MED ORDER — CELECOXIB 200 MG PO CAPS: 200.0000 mg | ORAL_CAPSULE | Freq: Two times a day (BID) | ORAL | 0 refills | Status: DC

## 2021-05-26 NOTE — Patient Instructions (Signed)
Pelvic Pain, Male ?Pelvic pain is pain in your lower abdomen, below your belly button and between your hips. The pain may start suddenly (be acute), keep coming back (recur), or last a long time (become chronic). Pelvic pain that lasts longer than six months is considered chronic. There are many possible causes of pelvic pain. Sometimes, the cause is not known. ?Pelvic pain may affect your: ?Prostate gland. ?Urinary system. ?Digestive tract. ?Musculoskeletal system. Strained muscles or ligaments may cause pelvic pain. ?Follow these instructions at home: ?Medicines ?Take over-the-counter and prescription medicines only as told by your health care provider. ?If you were prescribed an antibiotic medicine, take it as told by your health care provider. Do not stop taking the antibiotic even if you start to feel better. ?Managing pain, stiffness, and swelling ? ?Take warm water baths (sitz baths). Sitz baths help with relaxing your pelvic floor muscles. ?For a sitz bath, the water only comes up to your hips and covers your buttocks. A sitz bath may done at home in a bathtub or with a portable sitz bath that fits over the toilet. ?If directed, apply heat to the affected area before you exercise. Use the heat source that your health care provider recommends, such as a moist heat pack or a heating pad. ?Place a towel between your skin and the heat source. ?Leave the heat on for 20-30 minutes. ?Remove the heat if your skin turns bright red. This is especially important if you are unable to feel pain, heat, or cold. You may have a greater risk of getting burned. ?General instructions ?Rest as told by your health care provider. ?Keep a journal of your pelvic pain. Write down: ?When the pain started. ?Where the pain is located. ?What seems to make the pain better or worse. ?Any symptoms you have along with the pain. ?Follow your treatment plan as told by your health care provider. This may include: ?Pelvic physical  therapy. ?Yoga, meditation, and exercise. ?Biofeedback. This process trains you to manage your body's response (physiological response) through breathing techniques and relaxation methods. You will work with a therapist while machines are used to monitor your physical symptoms. ?Acupuncture. This is a type of treatment that involves stimulating specific points on your body by inserting thin needles through your skin to treat pain. ?Keep all follow-up visits as told by your health care provider. This is important. ?Contact a health care provider if: ?Medicine does not help your pain. ?Your pain comes back. ?You have new symptoms. ?You have a fever or chills. ?You are constipated. ?You have blood in your urine or stool. ?You feel weak or light-headed. ?Get help right away if: ?You have sudden severe pain. ?Your pain steadily gets worse. ?You have severe pain along with fever, nausea, vomiting, or excessive sweating. ?Summary ?Pelvic pain is pain in your lower abdomen, below your belly button and between your hips. There are many possible causes of pelvic pain. Sometimes, the cause is not known. ?Take over-the-counter and prescription medicines only as told by your health care provider. If you were prescribed an antibiotic medicine, take it as told by your health care provider. Do not stop taking the antibiotic even if you start to feel better. ?Contact a health care provider if you have new or worsening symptoms. ?Get help right away if you have severe pain along with fever, nausea, vomiting, or excessive sweating. ?Keep all follow-up visits as told by your health care provider. This is important. ?This information is not intended to replace  advice given to you by your health care provider. Make sure you discuss any questions you have with your health care provider. ?Document Revised: 06/28/2017 Document Reviewed: 06/28/2017 ?Elsevier Patient Education ? [place]. ? ?

## 2021-05-26 NOTE — Progress Notes (Signed)
? ?05/26/21 ?9:53 AM  ? ?Alexander Kennedy ?[birth year] ?[id number] ? ?CC: Left scrotal pain, left testicular lesion, nephrolithiasis ? ?HPI: ?51 year old male who presented to the ED in mid February 2023 with left-sided scrotal pain of unclear etiology.  CT showed a 1.5 cm nonobstructing left lower pole stone, and scrotal ultrasound showed a small 3 mm hypoechoic lesion in the superior pole of the left testicle that was indeterminant, most consistent with a cyst but follow-up was recommended.  He reports ongoing intermittent left-sided scrotal pain since that time, and seems to be worse with certain positions.  He has not tried any medications for this.  He denies any urinary symptoms or gross hematuria. ? ? ?PMH: ?Past Medical History:  ?Diagnosis Date  ? Anxiety   ? COPD (chronic obstructive pulmonary disease) ([place])   ? COVID-19 2020  ? GERD (gastroesophageal reflux disease)   ? RARE  ? History of kidney stones   ? HTN (hypertension) 10/13/2020  ? DIET CONTROLLED- NO MEDICATION  ? Hypertension   ? Inguinal hernia, bilateral   ? Pneumonia   ? Psoriasis   ? Sleep apnea   ? DOES NOT USE CPAP  ? Tremors of nervous system   ? ? ?Surgical History: ?Past Surgical History:  ?Procedure Laterality Date  ? BURR HOLE W/ STEREOTACTIC INSERTION OF DBS LEADS / INTRAOP MICROELECTRODE RECORDING    ? COLONOSCOPY WITH PROPOFOL N/A 08/27/2020  ? Procedure: COLONOSCOPY WITH PROPOFOL;  Surgeon: Virgel Manifold, MD;  Location: [facility];  Service: Endoscopy;  Laterality: N/A;  ? HAND ARTHROSCOPY Left   ? KNEE SURGERY Left   ? SHOULDER ARTHROSCOPY WITH OPEN ROTATOR CUFF REPAIR AND DISTAL CLAVICLE ACROMINECTOMY Right 10/17/2019  ? Procedure: SHOULDER ARTHROSCOPY WITH OPEN ROTATOR CUFF REPAIR AND biceps tenodysis;  Surgeon: Thornton Park, MD;  Location: [facility] ORS;  Service: Orthopedics;  Laterality: Right;  ? SHOULDER ARTHROSCOPY WITH OPEN ROTATOR CUFF REPAIR AND DISTAL CLAVICLE ACROMINECTOMY Right 10/20/2020  ? Procedure: RIGHT  SHOULDER ARTHROSCOPY WITH MINI-OPEN ROTATOR CUFF REPAIR AND DISTAL CLAVICLE ACROMINECTOMY;  Surgeon: Thornton Park, MD;  Location: [facility] ORS;  Service: Orthopedics;  Laterality: Right;  ? TRIGGER FINFER RELEASE    ? ON 2 FINGERS, AND EXPLORATORY SCOPING  ? ?Family History: ?Family History  ?Problem Relation Age of Onset  ? Congestive Heart Failure Father   ? Colon cancer Maternal Uncle   ? ? ?Social History:  reports that he quit smoking about 19 months ago. His smoking use included cigarettes. He has a 30.00 pack-year smoking history. He has been exposed to tobacco smoke. He has never used smokeless tobacco. He reports that he does not drink alcohol and does not use drugs. ? ?Physical Exam: ?BP 116/81   Pulse (!) 105   Ht '5\' 7"'$  (2.202 m)   Wt 205 lb (93 kg)   BMI 32.11 kg/m?   ? ?Constitutional:  Alert and oriented, No acute distress. ?Cardiovascular: No clubbing, cyanosis, or edema. ?Respiratory: Normal respiratory effort, no increased work of breathing. ?GI: Abdomen is soft, nontender, nondistended, no abdominal masses ?GU: Phallus with patent meatus, no lesions, testicles 20 cc and descended bilaterally without masses, testicles tender bilaterally to palpation ? ?Laboratory Data: ?Reviewed ? ?Pertinent Imaging: ?I have personally viewed and interpreted the CT and scrotal ultrasound, with CT showing 1.5 cm left lower pole nonobstructive stone, and a scrotal ultrasound showing a 3 mm left testicular cyst. ? ?Assessment & Plan:   ?51 year old male with left testicular pain of unclear etiology.  Work-up with  CT scan showed a nonobstructing 1.5 cm left lower pole stone, and scrotal ultrasound showed an indeterminate 3 mm left testicular cyst that was likely benign but repeat ultrasound was recommended in the future.  We discussed possible etiologies at length and I recommended starting with behavioral strategies including icing, snug fitting underwear, and a 2-week course of Celebrex.  Regarding his large left  lower pole stone we discussed options including ureteroscopy, shockwave lithotripsy, or surveillance, and he opts to start with surveillance.  Return precautions were discussed at length.  Finally, I recommended a repeat scrotal ultrasound per the prior recommendations for further evaluation of this small left-sided cyst that was originally seen in mid February.. ? ?-Celebrex 200 mg twice daily x2 weeks, behavioral strategies discussed regarding scrotal and groin pain ?-Repeat scrotal ultrasound in the next month regarding small left intratesticular cyst seen previously, call with results ?-RTC 6 months KUB surveillance of left lower pole stone ? ?Nickolas Madrid, MD ?05/26/2021 ? ?[facility] ?[address], [address] ?[place], [place] [address] ?[phone] ? ? ?

## 2021-06-02 ENCOUNTER — Ambulatory Visit
Admission: RE | Admit: 2021-06-02 | Discharge: 2021-06-02 | Disposition: A | Discharge status: Home / Self Care | Payer: Medicaid Other | Source: Ambulatory Visit | Attending: Urology | Admitting: Urology

## 2021-06-02 DIAGNOSIS — N5082 Scrotal pain: Secondary | ICD-10-CM | POA: Insufficient documentation

## 2021-06-07 ENCOUNTER — Other Ambulatory Visit: Payer: Self-pay

## 2021-06-07 DIAGNOSIS — N5082 Scrotal pain: Secondary | ICD-10-CM

## 2021-06-08 ENCOUNTER — Ambulatory Visit: Payer: Medicaid Other | Admitting: Urology

## 2021-06-08 ENCOUNTER — Encounter: Payer: Self-pay | Admitting: Urology

## 2021-06-08 VITALS — BP 124/84 | HR 94 | Ht 67.0 in | Wt 207.2 lb

## 2021-06-08 DIAGNOSIS — N442 Benign cyst of testis: Secondary | ICD-10-CM | POA: Diagnosis not present

## 2021-06-08 DIAGNOSIS — N5082 Scrotal pain: Secondary | ICD-10-CM | POA: Diagnosis not present

## 2021-06-08 DIAGNOSIS — N200 Calculus of kidney: Secondary | ICD-10-CM | POA: Diagnosis not present

## 2021-06-08 DIAGNOSIS — R102 Pelvic and perineal pain: Secondary | ICD-10-CM

## 2021-06-08 MED ORDER — GABAPENTIN 300 MG PO CAPS: 300.0000 mg | ORAL_CAPSULE | Freq: Two times a day (BID) | ORAL | 3 refills | Status: DC

## 2021-06-08 NOTE — Progress Notes (Signed)
? ?  06/08/2021 ?11:12 AM  ? ?Alexander Kennedy ?[birth year] ?[id number] ? ?Reason for visit: Follow up left scrotal pain, nephrolithiasis, left testicular cyst ? ?HPI: ?51 year old male who I saw on 05/26/2021 for the above issues.  He had originally presented to the ER in mid February 2023 with left-sided scrotal pain of unclear etiology, and CT showed a 1.5 cm nonobstructing left lower pole stone, and scrotal ultrasound showed a small 3 mm hypoechoic lesion in the superior pole left testicle that was indeterminant, but most consistent with a cyst. ? ?At our last visit we trialed 2 weeks of Celebrex, and I ordered a repeat scrotal ultrasound for evaluation of the small testicular cyst.  I personally viewed and interpreted the scrotal ultrasound dated 06/02/2021 that shows a stable 3 mm left testicular cyst, most likely benign.  He reports no significant improvement in the scrotal pain on the Celebrex.  His pain comes and goes, and he has trouble associating with any particular activity. ? ?On exam testicles are 20 cc and descended bilaterally without masses, testicles are tender bilaterally, more on the left than right. ? ?-We discussed the complexities of scrotal pain in the setting of benign imaging.  I do not think the scrotal pain is related to his nonobstructive left-sided stone.  We discussed other medication options like gabapentin or amitriptyline, and he is interested in trying gabapentin first.  We discussed other options like referral to urologist who performs denervation as an option in the future if he has refractory symptoms.  We also discussed ongoing need for surveillance of the moderate-sized left lower pole stone, but would hold off on any intervention until we get his scrotal pain improved. ?-Virtual visit 4 to 6 weeks symptom check, consider amitriptyline at that time if no improvement ? ? ? ?Billey Co, MD ? ?[facility] ?[address] ?[place], [place]  [address] ?[phone] ? ? ?

## 2021-06-17 ENCOUNTER — Ambulatory Visit: Payer: Medicaid Other | Admitting: Dermatology

## 2021-06-17 DIAGNOSIS — L4050 Arthropathic psoriasis, unspecified: Secondary | ICD-10-CM | POA: Diagnosis not present

## 2021-06-17 DIAGNOSIS — Z79899 Other long term (current) drug therapy: Secondary | ICD-10-CM | POA: Diagnosis not present

## 2021-06-17 DIAGNOSIS — L409 Psoriasis, unspecified: Secondary | ICD-10-CM | POA: Diagnosis not present

## 2021-06-17 NOTE — Patient Instructions (Signed)

## 2021-06-17 NOTE — Progress Notes (Signed)
? ?  Follow-Up Visit ?  ?Subjective  ?Alexander Kennedy is a 51 y.o. male who presents for the following: Psoriasis (6 months f/u psoriasis on the trunk and exts, pt taking Skyrizi injection every 12 weeks with a good response.). Pt report his psoriasis is clear on his skin, he still has a lot of joint pain. ? ?The following portions of the chart were reviewed this encounter and updated as appropriate:  ? Tobacco  Allergies  Meds  Problems  Med Hx  Surg Hx  Fam Hx   ?  ?Review of Systems:  No other skin or systemic complaints except as noted in HPI or Assessment and Plan. ? ?Objective  ?Well appearing patient in no apparent distress; mood and affect are within normal limits. ? ?A focused examination was performed including trunk,exts . Relevant physical exam findings are noted in the Assessment and Plan. ? ?trunk, exts ?No active psoriasis today ? ? ?Assessment & Plan  ?Psoriasis of Skin - improved from BSA 20% to BSA 0% ?With Psoriatic Arthritis - NOT improved - persistent joint pain ?trunk, exts ? ?Psoriasis - severe on systemic ?biologic? treatment injections.  Psoriasis is a chronic non-curable, but treatable genetic/hereditary disease that may have other systemic features affecting other organ systems such as joints (Psoriatic Arthritis).  It is linked with heart disease, inflammatory bowel disease, non-alcoholic fatty liver disease, and depression. Significant skin psoriasis and/or psoriatic arthritis may have significant symptoms and affects activities of daily activity and often benefits from systemic ?biologic? injection treatments.  These ?biologic? treatments have some potential side effects including immunosuppression and require pre-treatment laboratory screening and periodic laboratory monitoring and periodic in person evaluation and monitoring by the attending dermatologist physician (long term medication management).  ? ?With psoriatic arthritis -  ?BSA 0% on treatment with Skyrizi,  but  approximately a  20% BSA (before treatment) from patient description. Patient c/o joint pain especially in the knees, and some in the elbows. ?   ?Cont Skyrizi sq injections q 12 wks ? ?Referral to rheumatology ordered  ?Quantiferon TB gold ordered  ? ?Related Procedures ?Ambulatory referral to Rheumatology ?QuantiFERON-TB Gold Plus ? ?Related Medications ?Risankizumab-rzaa (SKYRIZI PEN) 150 MG/ML SOAJ ?Inject 150 mg into the skin as directed. Every 12 weeks for maintenance. ? ?Return in about 6 months (around 12/17/2021) for Psoriasis . ? ?I, Marye Round, CMA, am acting as scribe for Sarina Ser, MD .  ?Documentation: I have reviewed the above documentation for accuracy and completeness, and I agree with the above. ? ?Sarina Ser, MD ? ?

## 2021-06-28 ENCOUNTER — Encounter: Payer: Self-pay | Admitting: Dermatology

## 2021-06-29 ENCOUNTER — Telehealth: Payer: Self-pay

## 2021-06-29 NOTE — Telephone Encounter (Signed)
referral to Rheumatology faxed! ?

## 2021-07-20 ENCOUNTER — Encounter: Payer: Self-pay | Admitting: Urology

## 2021-07-20 ENCOUNTER — Ambulatory Visit (INDEPENDENT_AMBULATORY_CARE_PROVIDER_SITE_OTHER): Payer: Medicaid Other | Admitting: Urology

## 2021-07-20 DIAGNOSIS — N5082 Scrotal pain: Secondary | ICD-10-CM

## 2021-07-20 MED ORDER — GABAPENTIN 300 MG PO CAPS: 600.0000 mg | ORAL_CAPSULE | Freq: Two times a day (BID) | ORAL | 6 refills | Status: DC

## 2021-07-20 NOTE — Progress Notes (Signed)
Virtual Visit via Telephone Note  I connected with Jacques Earthly on 07/20/21 at 11:30 AM EDT by telephone and verified that I am speaking with the correct person using two identifiers.   Patient location: Home Provider location: [facility]   I discussed the limitations, risks, security and privacy concerns of performing an evaluation and management service by telephone and the availability of in person appointments. We discussed the impact of the COVID-19 pandemic on the healthcare system, and the importance of social distancing and reducing patient and provider exposure. I also discussed with the patient that there may be a patient responsible charge related to this service. The patient expressed understanding and agreed to proceed.  Reason for visit: Scrotal pain  History of Present Illness: 51 year old male with 3 to 4 months of intermittent bilateral scrotal pain, left worse than right.  CT benign aside from a 1.5 cm nonobstructing left lower pole stone.  Previously failed trial of Celebrex.  Was started on gabapentin 300 mg twice daily in April 2023.  He reports about 40% improvement on this medication, it is interested in increasing the dose.  We discussed alternatives including transitioning to amitriptyline, or referral to [facility] or [facility] to consider denervation.  He would like to start by increasing the gabapentin.   Follow Up: RTC 1 month symptom check   I discussed the assessment and treatment plan with the patient. The patient was provided an opportunity to ask questions and all were answered. The patient agreed with the plan and demonstrated an understanding of the instructions.   The patient was advised to call back or seek an in-person evaluation if the symptoms worsen or if the condition fails to improve as anticipated.  I provided 6 minutes of non-face-to-face time during this encounter.   Billey Co, MD

## 2021-08-22 IMAGING — DX DG SHOULDER 2+V*R*
3 series · 3 of 3 positions shown · non-contrast
Comparison: None.

CLINICAL DATA: Right shoulder pain due to an injury suffered in a
fall this morning. Initial encounter.

EXAM:
RIGHT SHOULDER - 2+ VIEW

[shoulder grashey]
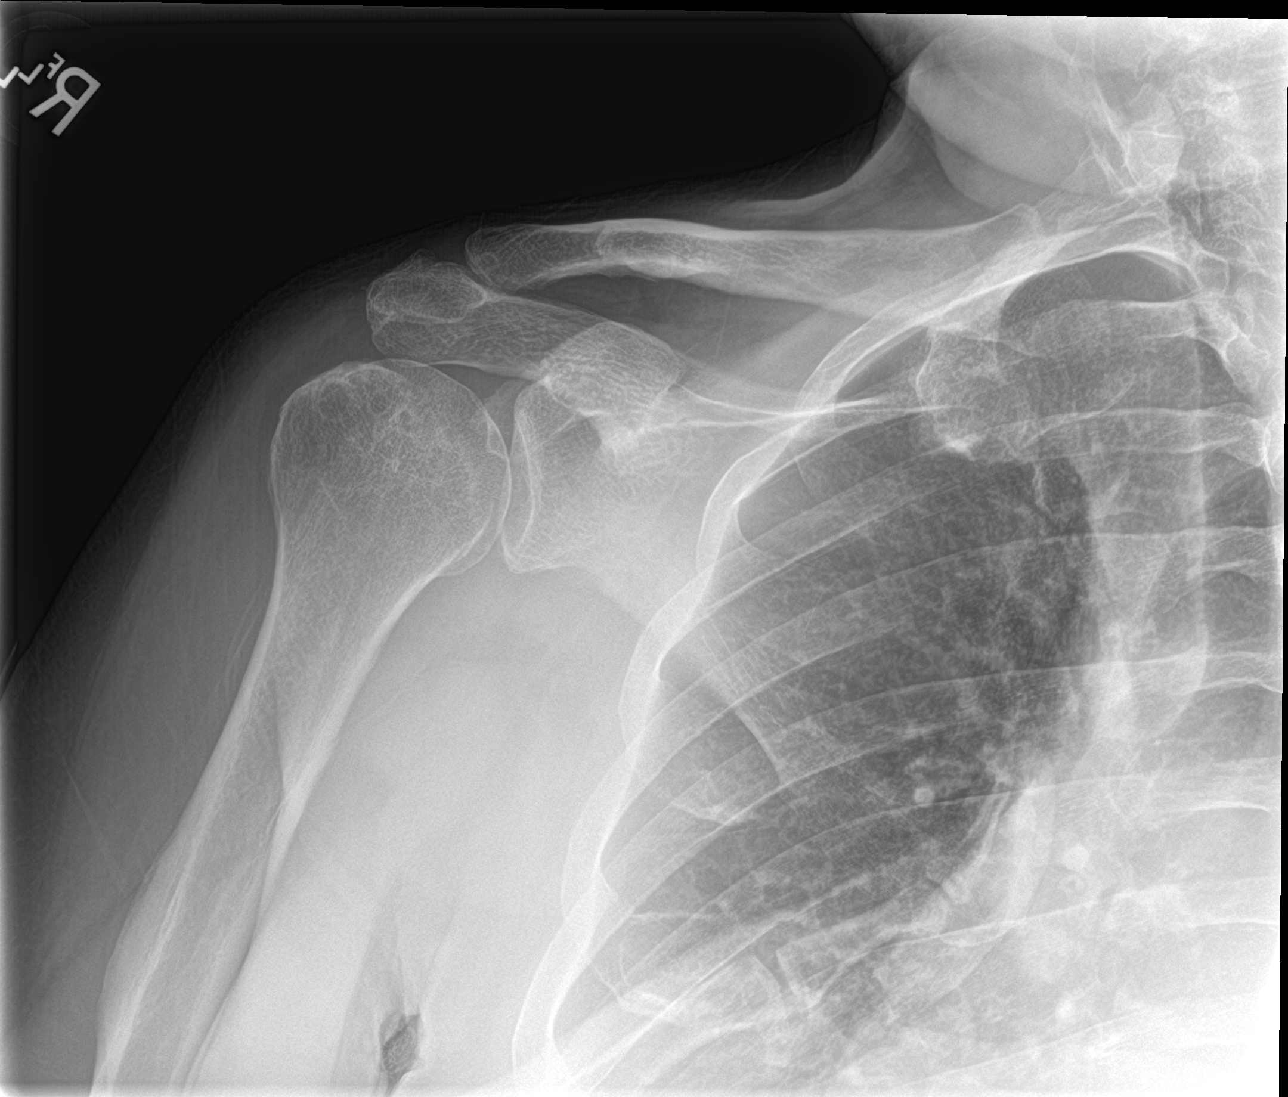

[shoulder y view]
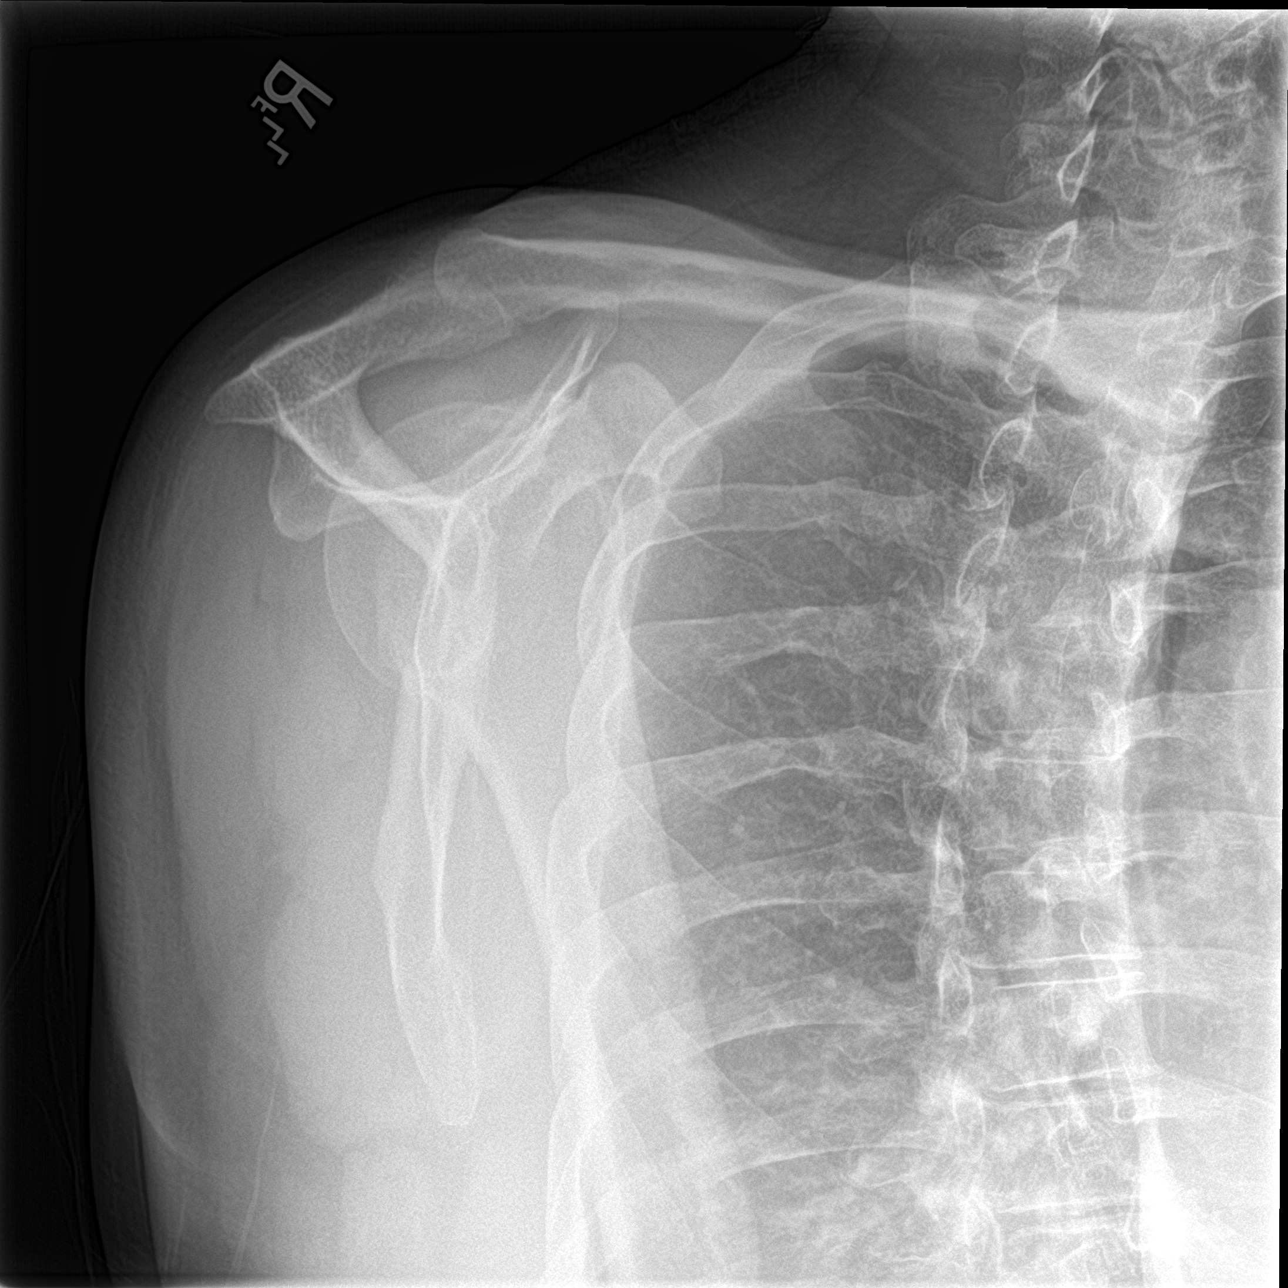

[shoulder axillary]
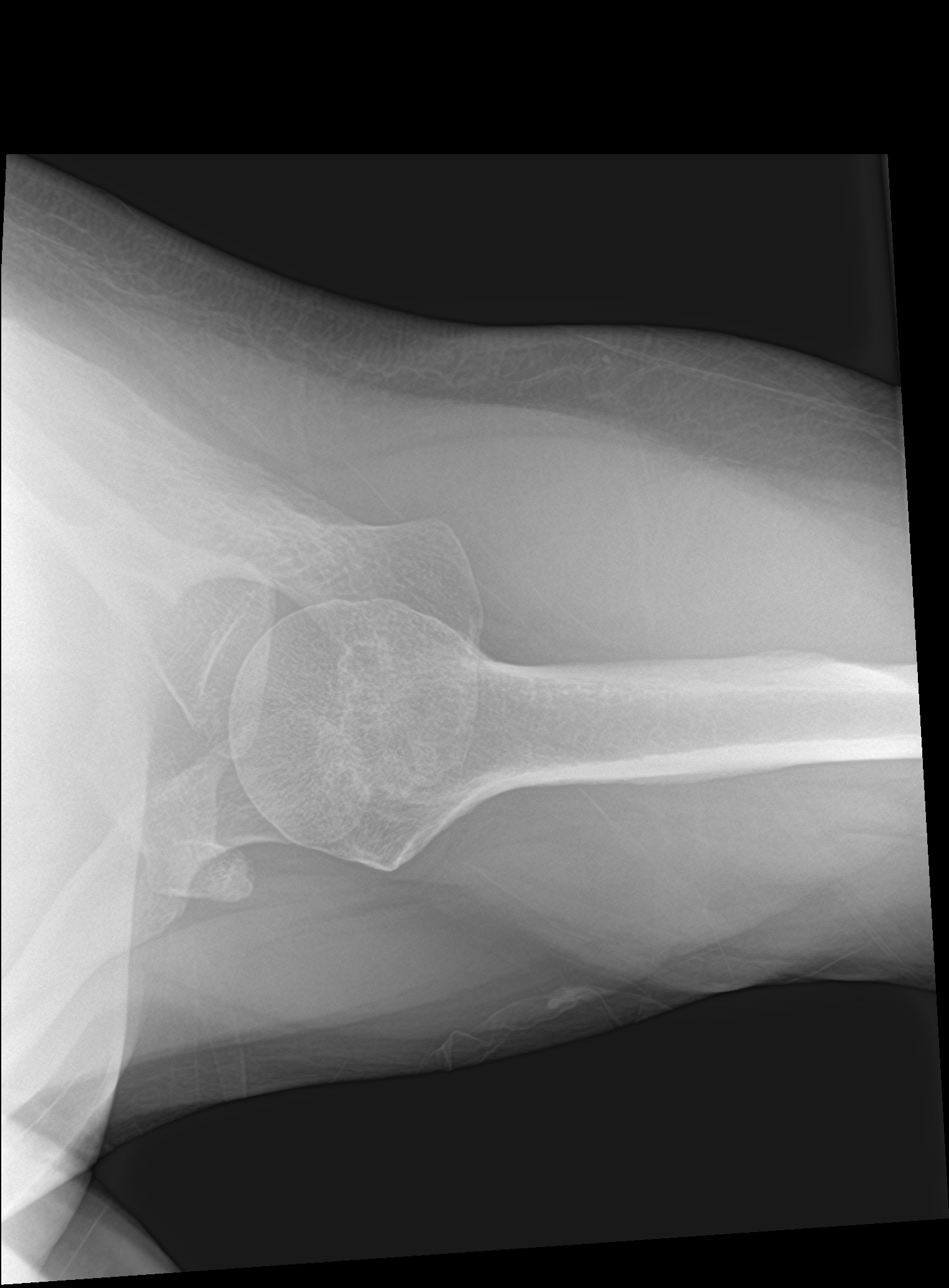

[3 of 3 positions shown; findings below may reference images not displayed]

FINDINGS: There is no evidence of fracture or dislocation. Mild
acromioclavicular osteoarthritis is noted. Soft tissues are
unremarkable.
IMPRESSION: No acute abnormality.

Mild acromioclavicular osteoarthritis.

## 2021-08-25 ENCOUNTER — Ambulatory Visit: Payer: Medicaid Other | Admitting: Urology

## 2021-09-30 ENCOUNTER — Ambulatory Visit: Payer: Medicaid Other | Admitting: Urology

## 2021-11-30 ENCOUNTER — Ambulatory Visit: Payer: Medicaid Other | Admitting: Urology

## 2021-12-20 ENCOUNTER — Ambulatory Visit: Payer: Medicaid Other | Admitting: Dermatology

## 2021-12-20 DIAGNOSIS — L4050 Arthropathic psoriasis, unspecified: Secondary | ICD-10-CM | POA: Diagnosis not present

## 2021-12-20 DIAGNOSIS — Z79899 Other long term (current) drug therapy: Secondary | ICD-10-CM

## 2021-12-20 DIAGNOSIS — L409 Psoriasis, unspecified: Secondary | ICD-10-CM | POA: Diagnosis not present

## 2021-12-20 DIAGNOSIS — D229 Melanocytic nevi, unspecified: Secondary | ICD-10-CM

## 2021-12-20 DIAGNOSIS — L814 Other melanin hyperpigmentation: Secondary | ICD-10-CM

## 2021-12-20 DIAGNOSIS — Z1283 Encounter for screening for malignant neoplasm of skin: Secondary | ICD-10-CM | POA: Diagnosis not present

## 2021-12-20 DIAGNOSIS — L578 Other skin changes due to chronic exposure to nonionizing radiation: Secondary | ICD-10-CM

## 2021-12-20 DIAGNOSIS — L821 Other seborrheic keratosis: Secondary | ICD-10-CM

## 2021-12-20 LAB — QUANTIFERON-TB GOLD PLUS
QuantiFERON Mitogen Value: 2.05 [IU]/mL
QuantiFERON Nil Value: 0 [IU]/mL
QuantiFERON TB1 Ag Value: 0 [IU]/mL
QuantiFERON TB2 Ag Value: 0 [IU]/mL
QuantiFERON-TB Gold Plus: NEGATIVE

## 2021-12-20 MED ORDER — SKYRIZI PEN 150 MG/ML ~~LOC~~ SOAJ: 150.0000 mg | SUBCUTANEOUS | 3 refills | Status: DC

## 2021-12-20 NOTE — Patient Instructions (Addendum)
Due to recent changes in healthcare laws, you may see results of your pathology and/or laboratory studies on MyChart before the doctors have had a chance to review them. We understand that in some cases there may be results that are confusing or concerning to you. Please understand that not all results are received at the same time and often the doctors may need to interpret multiple results in order to provide you with the best plan of care or course of treatment. Therefore, we ask that you please give us 2 business days to thoroughly review all your results before contacting the office for clarification. Should we see a critical lab result, you will be contacted sooner.   If You Need Anything After Your Visit  If you have any questions or concerns for your doctor, please call our main line at [phone] and press option 4 to reach your doctor's medical assistant. If no one answers, please leave a voicemail as directed and we will return your call as soon as possible. Messages left after 4 pm will be answered the following business day.   You may also send us a message via MyChart. We typically respond to MyChart messages within 1-2 business days.  For prescription refills, please ask your pharmacy to contact our office. Our fax number is [phone].  If you have an urgent issue when the clinic is closed that cannot wait until the next business day, you can page your doctor at the number below.    Please note that while we do our best to be available for urgent issues outside of office hours, we are not available 24/7.   If you have an urgent issue and are unable to reach us, you may choose to seek medical care at your doctor's office, retail clinic, urgent care center, or emergency room.  If you have a medical emergency, please immediately call 911 or go to the emergency department.  Pager Numbers  - Dr. Kowalski: [phone]  - Dr. Moye: [phone]  - Dr. Stewart:  [phone]  In the event of inclement weather, please call our main line at [phone] for an update on the status of any delays or closures.  Dermatology Medication Tips: Please keep the boxes that topical medications come in in order to help keep track of the instructions about where and how to use these. Pharmacies typically print the medication instructions only on the boxes and not directly on the medication tubes.   If your medication is too expensive, please contact our office at [phone] option 4 or send us a message through MyChart.   We are unable to tell what your co-pay for medications will be in advance as this is different depending on your insurance coverage. However, we may be able to find a substitute medication at lower cost or fill out paperwork to get insurance to cover a needed medication.   If a prior authorization is required to get your medication covered by your insurance company, please allow us 1-2 business days to complete this process.  Drug prices often vary depending on where the prescription is filled and some pharmacies may offer cheaper prices.  The website www.goodrx.com contains coupons for medications through different pharmacies. The prices here do not account for what the cost may be with help from insurance (it may be cheaper with your insurance), but the website can give you the price if you did not use any insurance.  - You can print the associated coupon and take it with   your prescription to the pharmacy.  - You may also stop by our office during regular business hours and pick up a GoodRx coupon card.  - If you need your prescription sent electronically to a different pharmacy, notify our office through [facility] MyChart or by phone at [phone] option 4.     Si Usted Necesita Algo Despus de Su Visita  Tambin puede enviarnos un mensaje a travs de MyChart. Por lo general respondemos a los mensajes de MyChart en el transcurso de 1 a 2  das hbiles.  Para renovar recetas, por favor pida a su farmacia que se ponga en contacto con nuestra oficina. Nuestro nmero de fax es el [phone].  Si tiene un asunto urgente cuando la clnica est cerrada y que no puede esperar hasta el siguiente da hbil, puede llamar/localizar a su doctor(a) al nmero que aparece a continuacin.   Por favor, tenga en cuenta que aunque hacemos todo lo posible para estar disponibles para asuntos urgentes fuera del horario de oficina, no estamos disponibles las 24 horas del da, los 7 das de la semana.   Si tiene un problema urgente y no puede comunicarse con nosotros, puede optar por buscar atencin mdica  en el consultorio de su doctor(a), en una clnica privada, en un centro de atencin urgente o en una sala de emergencias.  Si tiene una emergencia mdica, por favor llame inmediatamente al 911 o vaya a la sala de emergencias.  Nmeros de bper  - Dr. Kowalski: [phone]  - Dra. Moye: [phone]  - Dra. Stewart: [phone]  En caso de inclemencias del tiempo, por favor llame a nuestra lnea principal al [phone] para una actualizacin sobre el estado de cualquier retraso o cierre.  Consejos para la medicacin en dermatologa: Por favor, guarde las cajas en las que vienen los medicamentos de uso tpico para ayudarle a seguir las instrucciones sobre dnde y cmo usarlos. Las farmacias generalmente imprimen las instrucciones del medicamento slo en las cajas y no directamente en los tubos del medicamento.   Si su medicamento es muy caro, por favor, pngase en contacto con nuestra oficina llamando al [phone] y presione la opcin 4 o envenos un mensaje a travs de MyChart.   No podemos decirle cul ser su copago por los medicamentos por adelantado ya que esto es diferente dependiendo de la cobertura de su seguro. Sin embargo, es posible que podamos encontrar un medicamento sustituto a menor costo o llenar un formulario para que el  seguro cubra el medicamento que se considera necesario.   Si se requiere una autorizacin previa para que su compaa de seguros cubra su medicamento, por favor permtanos de 1 a 2 das hbiles para completar este proceso.  Los precios de los medicamentos varan con frecuencia dependiendo del lugar de dnde se surte la receta y alguna farmacias pueden ofrecer precios ms baratos.  El sitio web www.goodrx.com tiene cupones para medicamentos de diferentes farmacias. Los precios aqu no tienen en cuenta lo que podra costar con la ayuda del seguro (puede ser ms barato con su seguro), pero el sitio web puede darle el precio si no utiliz ningn seguro.  - Puede imprimir el cupn correspondiente y llevarlo con su receta a la farmacia.  - Tambin puede pasar por nuestra oficina durante el horario de atencin regular y recoger una tarjeta de cupones de GoodRx.  - Si necesita que su receta se enve electrnicamente a una farmacia diferente, informe a nuestra oficina a travs de MyChart de    o por telfono llamando al [phone] y presione la opcin 4.  

## 2021-12-20 NOTE — Progress Notes (Unsigned)
Follow-Up Visit   Subjective  Alexander Kennedy is a 51 y.o. male who presents for the following: Psoriasis (6 month psoriasis follow up. Hx at trunk and extremities. Currently on skyrizi. Patient states he is clear on treatment but still has pain in joints. ). The patient presents for Total-Body Skin Exam (TBSE) for skin cancer screening and mole check.  The patient has spots, moles and lesions to be evaluated, some may be new or changing and the patient has concerns that these could be cancer.  The following portions of the chart were reviewed this encounter and updated as appropriate:  Tobacco  Allergies  Meds  Problems  Med Hx  Surg Hx  Fam Hx     Review of Systems: No other skin or systemic complaints except as noted in HPI or Assessment and Plan.  Objective  Well appearing patient in no apparent distress; mood and affect are within normal limits.  A full examination was performed including scalp, head, eyes, ears, nose, lips, neck, chest, axillae, abdomen, back, buttocks, bilateral upper extremities, bilateral lower extremities, hands, feet, fingers, toes, fingernails, and toenails. All findings within normal limits unless otherwise noted below.  extremities and trunk Clear at exam    Assessment & Plan  Psoriasis extremities and trunk With psoriatic arthritis -  BSA 0% on treatment with Skyrizi,  but approximately a  20% BSA (before treatment) from patient description. Patient c/o joint pain especially in the knees, and some in the elbows.  Chronic and persistent condition with duration or expected duration over one year. Condition is symptomatic / bothersome to patient. Not to goal.  Consult Rheumatology on persistent joint pain.  Reviewed lab results for Quantiferon TB gold - normal Reviewed other labs from January and February - normal    Psoriasis - severe on systemic "biologic" treatment injections.  Psoriasis is a chronic non-curable, but treatable  genetic/hereditary disease that may have other systemic features affecting other organ systems such as joints (Psoriatic Arthritis).  It is linked with heart disease, inflammatory bowel disease, non-alcoholic fatty liver disease, and depression. Significant skin psoriasis and/or psoriatic arthritis may have significant symptoms and affects activities of daily activity and often benefits from systemic "biologic" injection treatments.  These "biologic" treatments have some potential side effects including immunosuppression and require pre-treatment laboratory screening and periodic laboratory monitoring and periodic in person evaluation and monitoring by the attending dermatologist physician (long term medication management).    Cont Skyrizi sq injections q 12 wks   Risankizumab-rzaa (SKYRIZI PEN) 150 MG/ML SOAJ - extremities and trunk Inject 150 mg into the skin as directed. Every 12 weeks for maintenance.  Related Procedures Ambulatory referral to Rheumatology  Lentigines - Scattered tan macules - Due to sun exposure - Benign-appearing, observe - Recommend daily broad spectrum sunscreen SPF 30+ to sun-exposed areas, reapply every 2 hours as needed. - Call for any changes  Seborrheic Keratoses - Stuck-on, waxy, tan-brown papules and/or plaques  - Benign-appearing - Discussed benign etiology and prognosis. - Observe - Call for any changes  Melanocytic Nevi - Tan-brown and/or pink-flesh-colored symmetric macules and papules - Benign appearing on exam today - Observation - Call clinic for new or changing moles - Recommend daily use of broad spectrum spf 30+ sunscreen to sun-exposed areas.   Hemangiomas - Red papules - Discussed benign nature - Observe - Call for any changes  Actinic Damage - Chronic condition, secondary to cumulative UV/sun exposure - diffuse scaly erythematous macules with underlying dyspigmentation - Recommend daily  broad spectrum sunscreen SPF 30+ to  sun-exposed areas, reapply every 2 hours as needed.  - Staying in the shade or wearing long sleeves, sun glasses (UVA+UVB protection) and wide brim hats (4-inch brim around the entire circumference of the hat) are also recommended for sun protection.  - Call for new or changing lesions.  Skin cancer screening performed today. Return in about 6 months (around 06/21/2022) for psoriasis . IRuthell Rummage, CMA, am acting as scribe for Sarina Ser, MD. Documentation: I have reviewed the above documentation for accuracy and completeness, and I agree with the above.  Sarina Ser, MD

## 2021-12-21 ENCOUNTER — Encounter: Payer: Self-pay | Admitting: Dermatology

## 2022-02-17 ENCOUNTER — Telehealth: Payer: Self-pay

## 2022-02-17 NOTE — Telephone Encounter (Signed)
I called and spoke with patient this morning about signing AOR form from [employer]. Patient states he received letter from [employer] and has signed this and sent back to them.

## 2022-04-11 ENCOUNTER — Other Ambulatory Visit: Payer: Self-pay | Admitting: Orthopedic Surgery

## 2022-04-11 DIAGNOSIS — M7501 Adhesive capsulitis of right shoulder: Secondary | ICD-10-CM

## 2022-04-21 ENCOUNTER — Encounter: Payer: Self-pay | Admitting: Radiology

## 2022-05-16 ENCOUNTER — Ambulatory Visit: Payer: Medicaid Other | Admitting: Dermatology

## 2022-05-16 ENCOUNTER — Encounter: Payer: Self-pay | Admitting: Dermatology

## 2022-05-16 VITALS — BP 118/83 | HR 91

## 2022-05-16 DIAGNOSIS — Z7189 Other specified counseling: Secondary | ICD-10-CM

## 2022-05-16 DIAGNOSIS — Z79899 Other long term (current) drug therapy: Secondary | ICD-10-CM | POA: Diagnosis not present

## 2022-05-16 DIAGNOSIS — L409 Psoriasis, unspecified: Secondary | ICD-10-CM

## 2022-05-16 MED ORDER — SKYRIZI PEN 150 MG/ML ~~LOC~~ SOAJ: 150.0000 mg | SUBCUTANEOUS | 3 refills | Status: AC

## 2022-05-16 NOTE — Patient Instructions (Addendum)
Due to recent changes in healthcare laws, you may see results of your pathology and/or laboratory studies on MyChart before the doctors have had a chance to review them. We understand that in some cases there may be results that are confusing or concerning to you. Please understand that not all results are received at the same time and often the doctors may need to interpret multiple results in order to provide you with the best plan of care or course of treatment. Therefore, we ask that you please give us 2 business days to thoroughly review all your results before contacting the office for clarification. Should we see a critical lab result, you will be contacted sooner.   If You Need Anything After Your Visit  If you have any questions or concerns for your doctor, please call our main line at [phone] and press option 4 to reach your doctor's medical assistant. If no one answers, please leave a voicemail as directed and we will return your call as soon as possible. Messages left after 4 pm will be answered the following business day.   You may also send us a message via MyChart. We typically respond to MyChart messages within 1-2 business days.  For prescription refills, please ask your pharmacy to contact our office. Our fax number is [phone].  If you have an urgent issue when the clinic is closed that cannot wait until the next business day, you can page your doctor at the number below.    Please note that while we do our best to be available for urgent issues outside of office hours, we are not available 24/7.   If you have an urgent issue and are unable to reach us, you may choose to seek medical care at your doctor's office, retail clinic, urgent care center, or emergency room.  If you have a medical emergency, please immediately call 911 or go to the emergency department.  Pager Numbers  - Dr. Kowalski: [phone]  - Dr. Moye: [phone]  - Dr. Stewart:  [phone]  In the event of inclement weather, please call our main line at [phone] for an update on the status of any delays or closures.  Dermatology Medication Tips: Please keep the boxes that topical medications come in in order to help keep track of the instructions about where and how to use these. Pharmacies typically print the medication instructions only on the boxes and not directly on the medication tubes.   If your medication is too expensive, please contact our office at [phone] option 4 or send us a message through MyChart.   We are unable to tell what your co-pay for medications will be in advance as this is different depending on your insurance coverage. However, we may be able to find a substitute medication at lower cost or fill out paperwork to get insurance to cover a needed medication.   If a prior authorization is required to get your medication covered by your insurance company, please allow us 1-2 business days to complete this process.  Drug prices often vary depending on where the prescription is filled and some pharmacies may offer cheaper prices.  The website www.goodrx.com contains coupons for medications through different pharmacies. The prices here do not account for what the cost may be with help from insurance (it may be cheaper with your insurance), but the website can give you the price if you did not use any insurance.  - You can print the associated coupon and take it with   your prescription to the pharmacy.  - You may also stop by our office during regular business hours and pick up a GoodRx coupon card.  - If you need your prescription sent electronically to a different pharmacy, notify our office through [place] MyChart or by phone at [phone] option 4.     Si Usted Necesita Algo Despus de Su Visita  Tambin puede enviarnos un mensaje a travs de MyChart. Por lo general respondemos a los mensajes de MyChart en el transcurso de 1 a 2  das hbiles.  Para renovar recetas, por favor pida a su farmacia que se ponga en contacto con nuestra oficina. Nuestro nmero de fax es el [phone].  Si tiene un asunto urgente cuando la clnica est cerrada y que no puede esperar hasta el siguiente da hbil, puede llamar/localizar a su doctor(a) al nmero que aparece a continuacin.   Por favor, tenga en cuenta que aunque hacemos todo lo posible para estar disponibles para asuntos urgentes fuera del horario de oficina, no estamos disponibles las 24 horas del da, los 7 das de la semana.   Si tiene un problema urgente y no puede comunicarse con nosotros, puede optar por buscar atencin mdica  en el consultorio de su doctor(a), en una clnica privada, en un centro de atencin urgente o en una sala de emergencias.  Si tiene una emergencia mdica, por favor llame inmediatamente al 911 o vaya a la sala de emergencias.  Nmeros de bper  - Dr. Kowalski: [phone]  - Dra. Moye: [phone]  - Dra. Stewart: [phone]  En caso de inclemencias del tiempo, por favor llame a nuestra lnea principal al [phone] para una actualizacin sobre el estado de cualquier retraso o cierre.  Consejos para la medicacin en dermatologa: Por favor, guarde las cajas en las que vienen los medicamentos de uso tpico para ayudarle a seguir las instrucciones sobre dnde y cmo usarlos. Las farmacias generalmente imprimen las instrucciones del medicamento slo en las cajas y no directamente en los tubos del medicamento.   Si su medicamento es muy caro, por favor, pngase en contacto con nuestra oficina llamando al [phone] y presione la opcin 4 o envenos un mensaje a travs de MyChart.   No podemos decirle cul ser su copago por los medicamentos por adelantado ya que esto es diferente dependiendo de la cobertura de su seguro. Sin embargo, es posible que podamos encontrar un medicamento sustituto a menor costo o llenar un formulario para que el  seguro cubra el medicamento que se considera necesario.   Si se requiere una autorizacin previa para que su compaa de seguros cubra su medicamento, por favor permtanos de 1 a 2 das hbiles para completar este proceso.  Los precios de los medicamentos varan con frecuencia dependiendo del lugar de dnde se surte la receta y alguna farmacias pueden ofrecer precios ms baratos.  El sitio web www.goodrx.com tiene cupones para medicamentos de diferentes farmacias. Los precios aqu no tienen en cuenta lo que podra costar con la ayuda del seguro (puede ser ms barato con su seguro), pero el sitio web puede darle el precio si no utiliz ningn seguro.  - Puede imprimir el cupn correspondiente y llevarlo con su receta a la farmacia.  - Tambin puede pasar por nuestra oficina durante el horario de atencin regular y recoger una tarjeta de cupones de GoodRx.  - Si necesita que su receta se enve electrnicamente a una farmacia diferente, informe a nuestra oficina a travs de MyChart de [place]   o por telfono llamando al [phone] y presione la opcin 4.      Due to recent changes in healthcare laws, you may see results of your pathology and/or laboratory studies on MyChart before the doctors have had a chance to review them. We understand that in some cases there may be results that are confusing or concerning to you. Please understand that not all results are received at the same time and often the doctors may need to interpret multiple results in order to provide you with the best plan of care or course of treatment. Therefore, we ask that you please give us 2 business days to thoroughly review all your results before contacting the office for clarification. Should we see a critical lab result, you will be contacted sooner.   If You Need Anything After Your Visit  If you have any questions or concerns for your doctor, please call our main line at [phone] and press option 4 to reach your  doctor's medical assistant. If no one answers, please leave a voicemail as directed and we will return your call as soon as possible. Messages left after 4 pm will be answered the following business day.   You may also send us a message via MyChart. We typically respond to MyChart messages within 1-2 business days.  For prescription refills, please ask your pharmacy to contact our office. Our fax number is [phone].  If you have an urgent issue when the clinic is closed that cannot wait until the next business day, you can page your doctor at the number below.    Please note that while we do our best to be available for urgent issues outside of office hours, we are not available 24/7.   If you have an urgent issue and are unable to reach us, you may choose to seek medical care at your doctor's office, retail clinic, urgent care center, or emergency room.  If you have a medical emergency, please immediately call 911 or go to the emergency department.  Pager Numbers  - Dr. Kowalski: [phone]  - Dr. Moye: [phone]  - Dr. Stewart: [phone]  In the event of inclement weather, please call our main line at [phone] for an update on the status of any delays or closures.  Dermatology Medication Tips: Please keep the boxes that topical medications come in in order to help keep track of the instructions about where and how to use these. Pharmacies typically print the medication instructions only on the boxes and not directly on the medication tubes.   If your medication is too expensive, please contact our office at [phone] option 4 or send us a message through MyChart.   We are unable to tell what your co-pay for medications will be in advance as this is different depending on your insurance coverage. However, we may be able to find a substitute medication at lower cost or fill out paperwork to get insurance to cover a needed medication.   If a prior authorization is  required to get your medication covered by your insurance company, please allow us 1-2 business days to complete this process.  Drug prices often vary depending on where the prescription is filled and some pharmacies may offer cheaper prices.  The website www.goodrx.com contains coupons for medications through different pharmacies. The prices here do not account for what the cost may be with help from insurance (it may be cheaper with your insurance), but the website can give you the price if you   did not use any insurance.  - You can print the associated coupon and take it with your prescription to the pharmacy.  - You may also stop by our office during regular business hours and pick up a GoodRx coupon card.  - If you need your prescription sent electronically to a different pharmacy, notify our office through [facility] MyChart or by phone at [phone] option 4.     Si Usted Necesita Algo Despus de Su Visita  Tambin puede enviarnos un mensaje a travs de MyChart. Por lo general respondemos a los mensajes de MyChart en el transcurso de 1 a 2 das hbiles.  Para renovar recetas, por favor pida a su farmacia que se ponga en contacto con nuestra oficina. Nuestro nmero de fax es el [phone].  Si tiene un asunto urgente cuando la clnica est cerrada y que no puede esperar hasta el siguiente da hbil, puede llamar/localizar a su doctor(a) al nmero que aparece a continuacin.   Por favor, tenga en cuenta que aunque hacemos todo lo posible para estar disponibles para asuntos urgentes fuera del horario de oficina, no estamos disponibles las 24 horas del da, los 7 das de la semana.   Si tiene un problema urgente y no puede comunicarse con nosotros, puede optar por buscar atencin mdica  en el consultorio de su doctor(a), en una clnica privada, en un centro de atencin urgente o en una sala de emergencias.  Si tiene una emergencia mdica, por favor llame inmediatamente al 911 o vaya a  la sala de emergencias.  Nmeros de bper  - Dr. Kowalski: [phone]  - Dra. Moye: [phone]  - Dra. Stewart: [phone]  En caso de inclemencias del tiempo, por favor llame a nuestra lnea principal al [phone] para una actualizacin sobre el estado de cualquier retraso o cierre.  Consejos para la medicacin en dermatologa: Por favor, guarde las cajas en las que vienen los medicamentos de uso tpico para ayudarle a seguir las instrucciones sobre dnde y cmo usarlos. Las farmacias generalmente imprimen las instrucciones del medicamento slo en las cajas y no directamente en los tubos del medicamento.   Si su medicamento es muy caro, por favor, pngase en contacto con nuestra oficina llamando al [phone] y presione la opcin 4 o envenos un mensaje a travs de MyChart.   No podemos decirle cul ser su copago por los medicamentos por adelantado ya que esto es diferente dependiendo de la cobertura de su seguro. Sin embargo, es posible que podamos encontrar un medicamento sustituto a menor costo o llenar un formulario para que el seguro cubra el medicamento que se considera necesario.   Si se requiere una autorizacin previa para que su compaa de seguros cubra su medicamento, por favor permtanos de 1 a 2 das hbiles para completar este proceso.  Los precios de los medicamentos varan con frecuencia dependiendo del lugar de dnde se surte la receta y alguna farmacias pueden ofrecer precios ms baratos.  El sitio web www.goodrx.com tiene cupones para medicamentos de diferentes farmacias. Los precios aqu no tienen en cuenta lo que podra costar con la ayuda del seguro (puede ser ms barato con su seguro), pero el sitio web puede darle el precio si no utiliz ningn seguro.  - Puede imprimir el cupn correspondiente y llevarlo con su receta a la farmacia.  - Tambin puede pasar por nuestra oficina durante el horario de atencin regular y recoger una tarjeta de cupones de  GoodRx.  - Si necesita que su receta se   enve electrnicamente a una farmacia diferente, informe a nuestra oficina a travs de MyChart de [place] o por telfono llamando al [phone] y presione la opcin 4.  

## 2022-05-16 NOTE — Progress Notes (Unsigned)
   Follow-Up Visit   Subjective  Alexander Kennedy is a 52 y.o. male who presents for the following: Psoriasis 6 month follow up patient is still using skyrizi. Reports that he has been staying clear while on injections and not having to use any topical treatments. Reports just had last injected in last month.  The following portions of the chart were reviewed this encounter and updated as appropriate: medications, allergies, medical history  Review of Systems:  No other skin or systemic complaints except as noted in HPI or Assessment and Plan.  Objective  Well appearing patient in no apparent distress; mood and affect are within normal limits.  Areas Examined: Trunk and extremities  - skin clear today  Assessment & Plan   PSORIASIS Skin clear today  Chronic condition with duration or expected duration over one year. Currently well-controlled.  Reviewed QTB Gold 12/15/21 negative, reviewed labs from 01/14/2022 CMP, Chemistries liver and kidney and CBC were all ok  Patient has history of joint aches especially in knees and some in elbows.  Consider Rheumatology consult  Psoriasis - severe on systemic "biologic" treatment injections.  Psoriasis is a chronic non-curable, but treatable genetic/hereditary disease that may have other systemic features affecting other organ systems such as joints (Psoriatic Arthritis).  It is linked with heart disease, inflammatory bowel disease, non-alcoholic fatty liver disease, and depression. Significant skin psoriasis and/or psoriatic arthritis may have significant symptoms and affects activities of daily activity and often benefits from systemic "biologic" injection treatments.  These "biologic" treatments have some potential side effects including immunosuppression and require pre-treatment laboratory screening and periodic laboratory monitoring and periodic in person evaluation and monitoring by the attending dermatologist physician (long term medication  management).  Long term medication management.  Patient is using long term (months to years) prescription medication  to control their dermatologic condition.  These medications require periodic monitoring to evaluate for efficacy and side effects and may require periodic laboratory monitoring.  Treatment Plan: Cont Skyriz sq injections q 12 wks. Will recheck labs at next followup  Counseling on psoriasis and coordination of care  psoriasis is a chronic non-curable, but treatable genetic/hereditary disease that may have other systemic features affecting other organ systems such as joints (Psoriatic Arthritis). It is associated with an increased risk of inflammatory bowel disease, heart disease, non-alcoholic fatty liver disease, and depression.  Treatments include light and laser treatments; topical medications; and systemic medications including oral and injectables.  Psoriasis Related Medications Risankizumab-rzaa (SKYRIZI PEN) 150 MG/ML SOAJ Inject 150 mg into the skin as directed. Every 12 weeks for maintenance.  Return in about 6 months (around 11/16/2022) for tbse, psoriasis .  IRuthell Rummage, CMA, am acting as scribe for Sarina Ser, MD.  Documentation: I have reviewed the above documentation for accuracy and completeness, and I agree with the above.  Sarina Ser, MD

## 2022-05-17 ENCOUNTER — Encounter: Payer: Self-pay | Admitting: Dermatology

## 2022-07-09 IMAGING — XA DG FLUORO GUIDE NDL PLC/BX
2 series · 2 of 2 positions shown · non-contrast
Comparison: none

CLINICAL DATA: Chronic, persistent right shoulder pain despite
rotator cuff repair last [REDACTED]. No recent injury.

[Series 1: ortho adipose · 1 of 1 slices shown (1 of 2)]
[im 1/1]
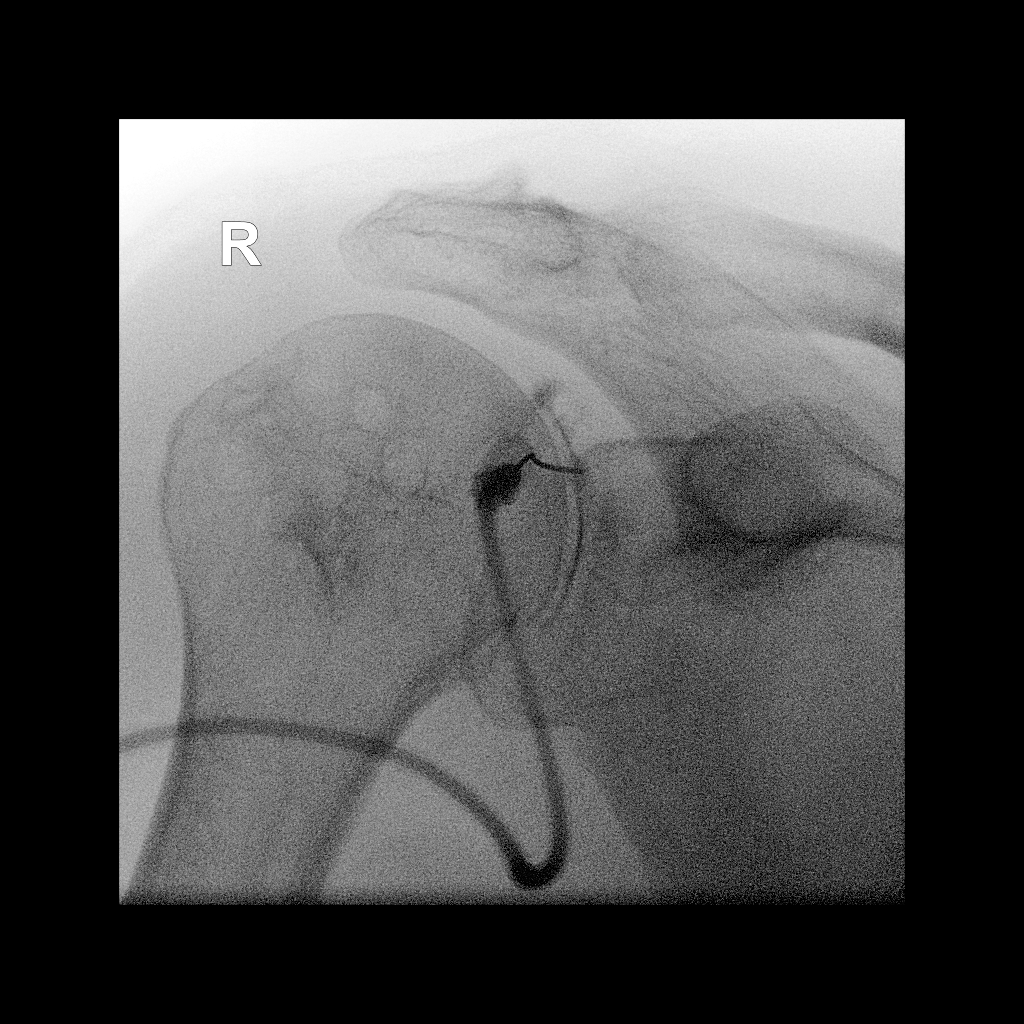

[Series 2: ortho adipose · 1 of 1 slices shown (2 of 2)]
[im 1/1]
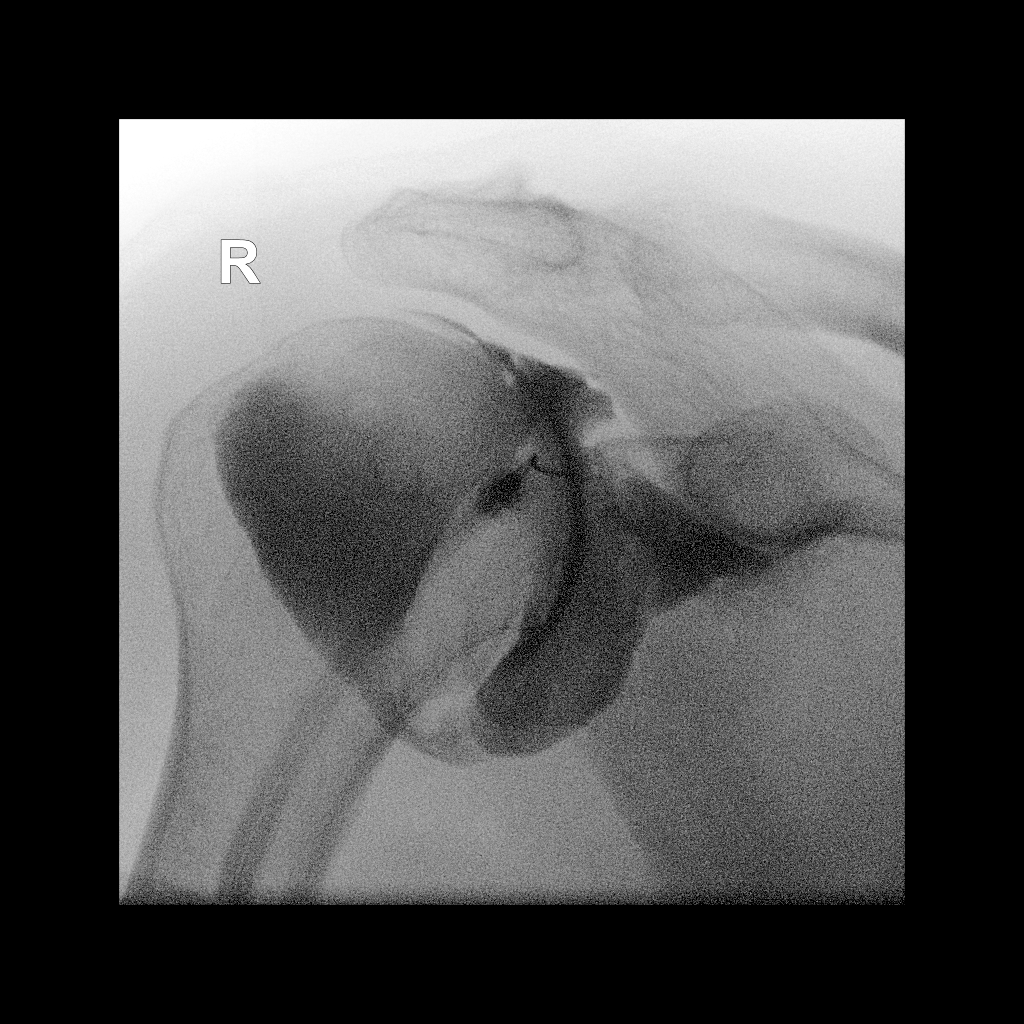

[2 of 2 positions shown; findings below may reference images not displayed]

FLUOROSCOPY TIME:  Radiation Exposure Index (as provided by the
fluoroscopic device): 0.8 mGy

Fluoroscopy Time:  20 seconds

Number of Acquired Images:  0

PROCEDURE:
The risks and benefits of the procedure were discussed with the
patient, and written informed consent was obtained. The patient
stated no history of allergy to contrast media. A formal timeout
procedure was performed with the patient according to departmental
protocol.

The patient was placed supine on the fluoroscopy table and the right
glenohumeral joint was identified under fluoroscopy. The skin
overlying the right glenohumeral joint was subsequently cleaned with
Betadine and a sterile drape was placed over the area of interest. 2
ml 1% Lidocaine was used to anesthetize the skin around the needle
insertion site.

A 22 gauge spinal needle was inserted into the right glenohumeral
joint under fluoroscopy.

12 ml of contrast mixture (5 ml of sterile saline and 15 ml of
Isovue-M 200 contrast) were injected into the right glenohumeral
joint. There was significant resistance to injection.

The needle was removed and hemostasis was achieved. The patient was
subsequently transferred to CT for imaging.
IMPRESSION: 1. Technically successful right shoulder injection for CT.
2. Significant resistance to injection, which can be seen with
adhesive capsulitis.

## 2022-10-06 ENCOUNTER — Ambulatory Visit: Payer: Medicaid Other | Admitting: Dermatology

## 2022-10-17 ENCOUNTER — Other Ambulatory Visit: Payer: Self-pay | Admitting: Urology

## 2022-11-07 ENCOUNTER — Encounter: Payer: Self-pay | Admitting: Dermatology

## 2022-11-07 ENCOUNTER — Ambulatory Visit: Payer: Medicaid Other | Admitting: Dermatology

## 2022-11-07 VITALS — BP 134/81 | HR 105

## 2022-11-07 DIAGNOSIS — Z1283 Encounter for screening for malignant neoplasm of skin: Secondary | ICD-10-CM | POA: Diagnosis not present

## 2022-11-07 DIAGNOSIS — B351 Tinea unguium: Secondary | ICD-10-CM

## 2022-11-07 DIAGNOSIS — W908XXA Exposure to other nonionizing radiation, initial encounter: Secondary | ICD-10-CM

## 2022-11-07 DIAGNOSIS — L821 Other seborrheic keratosis: Secondary | ICD-10-CM

## 2022-11-07 DIAGNOSIS — L409 Psoriasis, unspecified: Secondary | ICD-10-CM | POA: Diagnosis not present

## 2022-11-07 DIAGNOSIS — L578 Other skin changes due to chronic exposure to nonionizing radiation: Secondary | ICD-10-CM

## 2022-11-07 DIAGNOSIS — B353 Tinea pedis: Secondary | ICD-10-CM

## 2022-11-07 DIAGNOSIS — L814 Other melanin hyperpigmentation: Secondary | ICD-10-CM | POA: Diagnosis not present

## 2022-11-07 DIAGNOSIS — Z79899 Other long term (current) drug therapy: Secondary | ICD-10-CM

## 2022-11-07 DIAGNOSIS — D229 Melanocytic nevi, unspecified: Secondary | ICD-10-CM

## 2022-11-07 DIAGNOSIS — D1801 Hemangioma of skin and subcutaneous tissue: Secondary | ICD-10-CM

## 2022-11-07 MED ORDER — TERBINAFINE HCL 250 MG PO TABS: 250.0000 mg | ORAL_TABLET | Freq: Every day | ORAL | 0 refills | Status: DC

## 2022-11-07 NOTE — Patient Instructions (Addendum)
Terbinafine Counseling  Terbinafine is an anti-fungal medicine that can be applied to the skin (over the counter) or taken by mouth (prescription) to treat fungal infections. The pill version is often used to treat fungal infections of the nails or scalp. While most people do not have any side effects from taking terbinafine pills, some possible side effects of the medicine can include taste changes, headache, loss of smell, vision changes, nausea, vomiting, or diarrhea.   Rare side effects can include irritation of the liver, allergic reaction, or decrease in blood counts (which may show up as not feeling well or developing an infection). If you are concerned about any of these side effects, please stop the medicine and call your doctor, or in the case of an emergency such as feeling very unwell, seek immediate medical care.    Due to recent changes in healthcare laws, you may see results of your pathology and/or laboratory studies on MyChart before the doctors have had a chance to review them. We understand that in some cases there may be results that are confusing or concerning to you. Please understand that not all results are received at the same time and often the doctors may need to interpret multiple results in order to provide you with the best plan of care or course of treatment. Therefore, we ask that you please give [place] 2 business days to thoroughly review all your results before contacting the office for clarification. Should we see a critical lab result, you will be contacted sooner.   If You Need Anything After Your Visit  If you have any questions or concerns for your doctor, please call our main line at [phone] and press option 4 to reach your doctor's medical assistant. If no one answers, please leave a voicemail as directed and we will return your call as soon as possible. Messages left after 4 pm will be answered the following business day.   You may also send [place] a message via  MyChart. We typically respond to MyChart messages within 1-2 business days.  For prescription refills, please ask your pharmacy to contact our office. Our fax number is [phone].  If you have an urgent issue when the clinic is closed that cannot wait until the next business day, you can page your doctor at the number below.    Please note that while we do our best to be available for urgent issues outside of office hours, we are not available 24/7.   If you have an urgent issue and are unable to reach [place], you may choose to seek medical care at your doctor's office, retail clinic, urgent care center, or emergency room.  If you have a medical emergency, please immediately call 911 or go to the emergency department.  Pager Numbers  - Dr. Gwen Pounds: [phone]  - Dr. Roseanne Reno: [phone]  - Dr. Katrinka Blazing: [phone]   In the event of inclement weather, please call our main line at [phone] for an update on the status of any delays or closures.  Dermatology Medication Tips: Please keep the boxes that topical medications come in in order to help keep track of the instructions about where and how to use these. Pharmacies typically print the medication instructions only on the boxes and not directly on the medication tubes.   If your medication is too expensive, please contact our office at [phone] option 4 or send [place] a message through MyChart.   We are unable to tell what your co-pay for medications will  be in advance as this is different depending on your insurance coverage. However, we may be able to find a substitute medication at lower cost or fill out paperwork to get insurance to cover a needed medication.   If a prior authorization is required to get your medication covered by your insurance company, please allow [place] 1-2 business days to complete this process.  Drug prices often vary depending on where the prescription is filled and some pharmacies may offer cheaper  prices.  The website www.goodrx.com contains coupons for medications through different pharmacies. The prices here do not account for what the cost may be with help from insurance (it may be cheaper with your insurance), but the website can give you the price if you did not use any insurance.  - You can print the associated coupon and take it with your prescription to the pharmacy.  - You may also stop by our office during regular business hours and pick up a GoodRx coupon card.  - If you need your prescription sent electronically to a different pharmacy, notify our office through [facility] or by phone at [phone] option 4.     Si Usted Necesita Algo Despus de Su Visita  Tambin puede enviarnos un mensaje a travs de Clinical cytogeneticist. Por lo general respondemos a los mensajes de MyChart en el transcurso de 1 a 2 das hbiles.  Para renovar recetas, por favor pida a su farmacia que se ponga en contacto con nuestra oficina. Annie Sable de fax es [place] [phone].  Si tiene un asunto urgente cuando la clnica est cerrada y que no puede esperar hasta el siguiente da hbil, puede llamar/localizar a su doctor(a) al nmero que aparece a continuacin.   Por favor, tenga en cuenta que aunque hacemos todo lo posible para estar disponibles para asuntos urgentes fuera del horario de [place], no estamos disponibles las 24 horas del da, los 7 [address]  [address] de la [place].   Si tiene un problema urgente y no puede comunicarse con nosotros, puede optar por buscar atencin mdica  en el consultorio de su doctor(a), en una clnica privada, en un centro de atencin urgente o en una sala de emergencias.  Si tiene Engineer, drilling, por favor llame inmediatamente al 911 o vaya a la sala de emergencias.  Nmeros de bper  - Dr. Gwen Pounds: [phone]  - Dra. Roseanne Reno: [phone]  - Dr. Katrinka Blazing: [phone]   En caso de inclemencias del tiempo, por favor llame a Lacy Duverney principal al [phone]  para una actualizacin sobre el [place] de cualquier retraso o cierre.  Consejos para la medicacin en dermatologa: Por favor, guarde las cajas en las que vienen los medicamentos de uso tpico para ayudarle a seguir las instrucciones sobre dnde y cmo usarlos. Las farmacias generalmente imprimen las instrucciones del medicamento slo en las cajas y no directamente en los tubos del [place].   Si su medicamento es muy caro, por favor, pngase en contacto con Rolm Gala llamando al [phone] y presione la opcin 4 o envenos un mensaje a travs de Clinical cytogeneticist.   No podemos decirle cul ser su copago por los medicamentos por adelantado ya que esto es diferente dependiendo de la cobertura de su seguro. Sin embargo, es posible que podamos encontrar un medicamento sustituto a Audiological scientist un formulario para que el seguro cubra el medicamento que se considera necesario.   Si se requiere una autorizacin previa para que su compaa de seguros [place] su medicamento, por favor permtanos de  1 a 2 das hbiles para completar [address]  Los precios de los medicamentos varan con frecuencia dependiendo del Environmental consultant de dnde se surte la receta y alguna farmacias pueden ofrecer precios ms baratos.  El sitio web www.goodrx.com tiene cupones para medicamentos de Health and safety inspector. Los precios aqu no tienen en cuenta lo que podra costar con la ayuda del seguro (puede ser ms barato con su seguro), pero el sitio web puede darle el precio si no utiliz Tourist information centre manager.  - Puede imprimir el cupn correspondiente y llevarlo con su receta a la farmacia.  - Tambin puede pasar por nuestra oficina durante el horario de atencin regular y Education officer, museum una tarjeta de cupones de GoodRx.  - Si necesita que su receta se enve electrnicamente a una farmacia diferente, informe a nuestra oficina a travs de MyChart de [place] o por telfono llamando al [phone] y presione la opcin 4.

## 2022-11-07 NOTE — Addendum Note (Signed)
Addended by: Elie Goody on: 11/07/2022 09:27 AM   Modules accepted: Orders

## 2022-11-07 NOTE — Progress Notes (Addendum)
Follow-Up Visit   Subjective  Alexander Kennedy is a 52 y.o. male who presents for the following: Skin Cancer Screening and Full Body Skin Exam.   The patient presents for Total-Body Skin Exam (TBSE) for skin cancer screening and mole check. The patient has spots, moles and lesions to be evaluated, some may be new or changing and the patient may have concern these could be cancer.  The following portions of the chart were reviewed this encounter and updated as appropriate: medications, allergies, medical history  Review of Systems:  No other skin or systemic complaints except as noted in HPI or Assessment and Plan.  Objective  Well appearing patient in no apparent distress; mood and affect are within normal limits.  A full examination was performed including scalp, head, eyes, ears, nose, lips, neck, chest, axillae, abdomen, back, buttocks, bilateral upper extremities, bilateral lower extremities, hands, feet, fingers, toes, fingernails, and toenails. All findings within normal limits unless otherwise noted below.   Relevant physical exam findings are noted in the Assessment and Plan.   Assessment & Plan   SKIN CANCER SCREENING PERFORMED TODAY.  ACTINIC DAMAGE - Chronic condition, secondary to cumulative UV/sun exposure - diffuse scaly erythematous macules with underlying dyspigmentation - Recommend daily broad spectrum sunscreen SPF 30+ to sun-exposed areas, reapply every 2 hours as needed.  - Staying in the shade or wearing long sleeves, sun glasses (UVA+UVB protection) and wide brim hats (4-inch brim around the entire circumference of the hat) are also recommended for sun protection.  - Call for new or changing lesions.  LENTIGINES, SEBORRHEIC KERATOSES, HEMANGIOMAS - Benign normal skin lesions - Benign-appearing - Call for any changes  MELANOCYTIC NEVI - Tan-brown and/or pink-flesh-colored symmetric macules and papules - Benign appearing on exam today - Observation - Call  clinic for new or changing moles - Recommend daily use of broad spectrum spf 30+ sunscreen to sun-exposed areas.   PSORIASIS Exam: clear 0% BSA.  Chronic condition with duration or expected duration over one year. Currently well-controlled.  Patient has history of joint aches especially in knees, back, and some in elbows.  Consider Rheumatology consult  Psoriasis is a chronic non-curable, but treatable genetic/hereditary disease that may have other systemic features affecting other organ systems such as joints (Psoriatic Arthritis). It is associated with an increased risk of inflammatory bowel disease, heart disease, non-alcoholic fatty liver disease, and depression.  Treatments include light and laser treatments; topical medications; and systemic medications including oral and injectables.  Treatment Plan: Pending lab results continue Skyrizi 150 mg/mL SQ Q3M. Reviewed risks of biologics including immunosuppression, infections, injection site reaction, and failure to improve condition. Goal is control of skin condition, not cure.  Some older biologics such as Humira and Enbrel may slightly increase risk of malignancy and may worsen congestive heart failure.  Taltz and Cosentyx may cause inflammatory bowel disease to flare. The use of biologics requires long term medication management, including periodic office visits and monitoring of blood work.  Patient concerned about Q-tip cotton lodged in the L ear. Advised pt he should follow up with his PCP as they have instruments to see further inside the ear canal. Ear wax present today, otherwise clear.   ONYCHOMYCOSIS with tinea pedis - reviewed CMP and CBC  Exam: Thickened toenails with subungal debris c/w onychomycosis. Scale of the feet.  Reviewed 01/14/22 labs, normal renal hepatic function. Slightly low hgb  Chronic and persistent condition with duration or expected duration over one year. Condition is symptomatic/ bothersome  to patient. Not  currently at goal.  Treatment Plan: Recheck CBC with diff and CMP Start Terbinafine 250 mg po QD x 12 weeks. Consider OTC Lamisil cream for the feet, but oral Terbinafine will treat this as well. Terbinafine Counseling  Terbinafine is an anti-fungal medicine that can be applied to the skin (over the counter) or taken by mouth (prescription) to treat fungal infections. The pill version is often used to treat fungal infections of the nails or scalp. While most people do not have any side effects from taking terbinafine pills, some possible side effects of the medicine can include taste changes, headache, loss of smell, vision changes, nausea, vomiting, or diarrhea.   Rare side effects can include irritation of the liver, allergic reaction, or decrease in blood counts (which may show up as not feeling well or developing an infection). If you are concerned about any of these side effects, please stop the medicine and call your doctor, or in the case of an emergency such as feeling very unwell, seek immediate medical care.   Psoriasis  Related Medications Risankizumab-rzaa (SKYRIZI PEN) 150 MG/ML SOAJ Inject 150 mg into the skin as directed. Every 12 weeks for maintenance.  Encounter for long-term (current) use of high-risk medication  Related Procedures QuantiFERON-TB Gold Plus  Multiple benign nevi  Seborrheic keratoses  Lentigines  Cherry angioma  Onychomycosis   Return in about 6 months (around 05/07/2023) for onychomycosis and psoriasis follow up; 1 year TBSE.  Maylene Roes, CMA, am acting as scribe for Elie Goody, MD .  Documentation: I have reviewed the above documentation for accuracy and completeness, and I agree with the above.  Elie Goody, MD

## 2022-11-10 LAB — QUANTIFERON-TB GOLD PLUS
QuantiFERON Mitogen Value: >10 IU/mL
QuantiFERON Nil Value: 0.03 IU/mL
QuantiFERON TB1 Ag Value: 0.03 IU/mL
QuantiFERON TB2 Ag Value: 0.07 IU/mL
QuantiFERON-TB Gold Plus: NEGATIVE

## 2022-11-14 ENCOUNTER — Ambulatory Visit: Payer: Medicaid Other | Admitting: Dermatology

## 2022-11-17 ENCOUNTER — Ambulatory Visit: Payer: Medicaid Other | Admitting: Dermatology

## 2023-02-05 ENCOUNTER — Other Ambulatory Visit: Payer: Self-pay | Admitting: Dermatology

## 2023-05-08 ENCOUNTER — Ambulatory Visit: Payer: Medicaid Other | Admitting: Dermatology

## 2023-09-08 NOTE — Progress Notes (Signed)
 History of Present Illness:   Alexander Kennedy is a 53 y.o. male here for concern for abscess on his left buttocks.  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  He cannot sit comfortably except for on his right buttocks.  His wife tried to squeeze it last night.  He states that he believes that it is draining, secondary to finding drainage on his underwear.  He has had abscesses on his body in other places.  However, he has never had them on his buttocks.  He states that is quite uncomfortable sitting.  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   Past Medical History:   Past Medical History:  Diagnosis Date  . Back pain 01/20/2017  . Essential tremor 10/07/2015  . OSA on CPAP 03/27/2018   Formatting of this note might be different from the original. 11/2017 HSAT AHI: 5.4 (ODI: 11.1) with associated desaturations (O2 nadir: 85%)  . Psoriasis 01/20/2017    Past Surgical History:   Past Surgical History:  Procedure Laterality Date  . ARTHROTOMY HAND Left 04/2020  . ARTHROSCOPIC ROTATOR CUFF REPAIR Right 10/20/2020    Allergies:   Allergies  Allergen Reactions  . Penicillins Dizziness, Itching, Other (See Comments), Shortness Of Breath and Unknown    Other: Dizziness Tolerated 1st generation cephalosporin (CEFAZOLIN ) on 10/17/2019 without ADRs. PCN reaction causing immediate rash, facial/tongue/throat swelling, SOB or lightheadedness with hypotension: Yes PCN reaction causing severe rash involving mucus membranes or skin necrosis: No PCN reaction that required hospitalization: No PCN reaction occurring within the last 10 years: No If all of the above answers are NO, then may proceed with Cephalosporin use. Dizziness also Has patient had a PCN reaction causing immediate rash, facial/tongue/throat swelling, SOB or lightheadedness with hypotension: Yes Has patient had a PCN reaction causing severe rash involving mucus  membranes or skin necrosis: No Has patient had a PCN reaction that required hospitalization: No Has patient had a PCN reaction occurring within the last 10 years: No If all of the above answers are NO, then may proceed with Cephalosporin use. Dizziness also Has patient had a PCN reaction causing immediate rash, facial/tongue/throat swelling, SOB or lightheadedness with hypotension: Yes Has patient had a PCN reaction causing severe rash involving mucus membranes or skin necrosis: No Has patient had a PCN reaction that required hospitalization: No Has patient had a PCN reaction occurring within the last 10 years: No If all of the above answers are NO, then may proceed with Cephalosporin use. Dizziness also Has patient had a PCN reaction causing immediate rash, facial/tongue/throat swelling, SOB or lightheadedness with hypotension: Yes Has patient had a PCN reaction causing severe rash involving mucus membranes or skin necrosis: No Has patient had a PCN reaction that required hospitalization: No Has patient had a PCN reaction occurring within the last 10 years: No If all of the above answers are NO, then may proceed with Cephalosporin use.  Other: Dizziness Tolerated 1st generation cephalosporin (CEFAZOLIN ) on 10/17/2019 without ADRs. PCN reaction causing immediate rash, facial/tongue/throat swelling, SOB or lightheadedness with hypotension: Yes PCN reaction causing severe rash involving mucus membranes or skin necrosis: No PCN reaction that required hospitalization: No PCN reaction occurring within the last 10 years: No If all of the above answers are NO, then may proceed with Cephalosporin use.  Other reaction(s): Dizziness, Other (See Comments), Unknown Other: Dizziness Tolerated 1st generation cephalosporin (CEFAZOLIN ) on 10/17/2019 without ADRs. PCN  reaction causing immediate rash, facial/tongue/throat swelling, SOB or lightheadedness with hypotension: Yes PCN reaction causing  severe rash involving mucus membranes or skin necrosis: No PCN reaction that required hospitalization: No PCN reaction occurring within the last 10 years: No If all of the above answers are NO, then may proceed with Cephalosporin use.Dizziness also Has patient had a PCN reaction causing immediate rash, facial/tongue/throat swelling, SOB or lightheadedness with hypotension: Yes Has patient had a PCN reaction causing severe rash involving mucus membranes or skin necrosis: No Has patient had a PCN reaction that required hospitalization: No Has patient had a PCN reaction occurring within the last 10 years: No If all of the above answers are NO, then may proceed with Cephalosporin use. Dizziness also Has patient had a PCN reac... (TRUNCATED)  . Antihistamines - Alkylamine Other (See Comments)    Other reaction(s): Other (See Comments) sneeze  . Chlorpheniramine-Phenylpropan Other (See Comments)    sneeze    Current Medications:   Prior to Admission medications  Medication Sig Taking? Last Dose  albuterol  MDI, PROVENTIL , VENTOLIN , PROAIR , HFA 90 mcg/actuation inhaler Inhale 2 inhalations into the lungs every 4 (four) hours as needed for Wheezing Yes PRN Not Currently Taking  traZODone (DESYREL) 100 MG tablet trazodone 100 mg tablet  TAKE 2 TO 3 TABLETS BY MOUTH AT BEDTIME FOR SLEEP Yes Taking  amLODIPine (NORVASC) 5 MG tablet Take 5 mg by mouth once daily as needed Patient not taking: Reported on 09/08/2023  Not Taking  doxycycline  (VIBRAMYCIN ) 100 MG capsule Take 1 capsule (100 mg total) by mouth 2 (two) times daily for 10 days    escitalopram oxalate (LEXAPRO) 10 MG tablet Take 10 mg by mouth once daily Patient not taking: Reported on 09/08/2023  Not Taking  gabapentin  (NEURONTIN ) 300 MG capsule Take 300 mg by mouth 2 (two) times daily Patient not taking: Reported on 09/08/2023  Not Taking  gabapentin  (NEURONTIN ) 300 MG capsule Take 600 mg by mouth 2 (two) times daily Patient not  taking: Reported on 09/08/2023  Not Taking  HYDROcodone-acetaminophen  (NORCO) 5-325 mg tablet Take one tablet at night for pain; may take up to every 6 hours as needed for pain if not working or driving    hydrOXYzine (ATARAX) 25 MG tablet hydroxyzine HCl 25 mg tablet Patient not taking: Reported on 09/08/2023  Not Taking  inhalational spacer (AEROCHAMBER) spacer Please instruct patient on use Patient not taking: Reported on 09/08/2023  Not Taking  ipratropium-albuteroL  (DUO-NEB) nebulizer solution Take 3 mLs by nebulization 4 (four) times daily for 30 days    naproxen (NAPROSYN) 500 MG tablet Take 1 tablet (500 mg total) by mouth 2 (two) times daily with meals for 10 days Take with food.    polyethylene glycol (MIRALAX ) powder Take by mouth Patient not taking: Reported on 09/08/2023  Not Taking  promethazine -dextromethorphan (PROMETHAZINE -DM) 6.25-15 mg/5 mL syrup Take 5 mLs by mouth every 6 (six) hours as needed for Cough Maximum 30 mL / 24-hour Patient not taking: Reported on 09/08/2023  Not Taking  risankizumab -rzaa (SKYRIZI ) 150 mg/mL subcutaneous inj syringe Inject 150 mg subcutaneously Patient not taking: Reported on 09/08/2023  Not Taking  sildenafiL (VIAGRA) 50 MG tablet SMARTSIG:1 Tablet(s) By Mouth Patient not taking: Reported on 09/08/2023  Not Taking  terBINafine  HCL (LAMISIL ) 250 mg tablet Take 250 mcg by mouth every morning before breakfast (0630) Patient not taking: Reported on 09/08/2023  Not Taking    Family History:   Family History  Problem Relation Name Age of Onset  .  No Known Problems Mother    . Myocardial Infarction (Heart attack) Father      Social History:   Social History   Socioeconomic History  . Marital status: Single  Tobacco Use  . Smoking status: Former    Types: Cigarettes    Passive exposure: Past  . Smokeless tobacco: Never  Vaping Use  . Vaping status: Every Day  Substance and Sexual Activity  . Alcohol use: Not Currently  . Drug use: Not  Currently  . Sexual activity: Defer    Review of Systems:   Review of Systems  Constitutional:  Negative for chills, fatigue, fever and unexpected weight change.  HENT:  Negative for ear pain, rhinorrhea and sore throat.   Eyes:  Negative for pain and visual disturbance.  Respiratory:  Negative for cough, chest tightness and shortness of breath.   Cardiovascular:  Negative for chest pain, palpitations and leg swelling.  Gastrointestinal:  Negative for abdominal pain, constipation and diarrhea.  Genitourinary:  Negative for frequency and hematuria.  Musculoskeletal:  Negative for arthralgias, joint swelling and myalgias.  Skin:  Positive for color change and wound. Negative for rash.  Neurological:  Negative for headaches.  All other systems reviewed and are negative.   Vitals:   Vitals:   09/08/23 1019  BP: 109/70  Pulse: 90  Temp: 36.6 C (97.9 F)  TempSrc: Oral  SpO2: 95%  Weight: (!) 101.6 kg (224 lb)  Height: 170.2 cm (5' 7)     Body mass index is 35.08 kg/m.  Physical Exam:   Physical Exam Vitals and nursing note reviewed.  Constitutional:      Appearance: He is well-developed.  HENT:     Head: Normocephalic and atraumatic.     Right Ear: Hearing, tympanic membrane, ear canal and external ear normal.     Left Ear: Hearing, tympanic membrane, ear canal and external ear normal.     Nose: Nose normal.     Mouth/Throat:     Pharynx: Oropharynx is clear. Uvula midline.   Eyes:     General: Lids are normal. Vision grossly intact.     Conjunctiva/sclera: Conjunctivae normal.     Pupils: Pupils are equal, round, and reactive to light.    Cardiovascular:     Rate and Rhythm: Normal rate and regular rhythm.     Pulses: Normal pulses.     Heart sounds: Normal heart sounds. No murmur heard. Pulmonary:     Effort: Pulmonary effort is normal.     Breath sounds: Normal breath sounds. No wheezing.   Musculoskeletal:        General: Normal range of motion.      Cervical back: Full passive range of motion without pain, normal range of motion and neck supple.   Skin:    General: Skin is warm and dry.     Findings: Abscess present.         Comments: 4 cm area of induration with central opening draining serous fluid, with surrounding erythema over the inferior portion of the left buttocks probably within the intergluteal cleft.   Neurological:     General: No focal deficit present.     Mental Status: He is alert and oriented to person, place, and time.   Psychiatric:        Attention and Perception: Attention and perception normal.        Mood and Affect: Mood and affect normal.        Speech: Speech normal.  Behavior: Behavior normal. Behavior is cooperative.        Thought Content: Thought content normal.     Assessment and Plan:  No results found for this visit on 09/08/23.  Diagnoses and all orders for this visit:  Abscess of buttock, left  Other orders -     doxycycline  (VIBRAMYCIN ) 100 MG capsule; Take 1 capsule (100 mg total) by mouth 2 (two) times daily for 10 days -     HYDROcodone-acetaminophen  (NORCO) 5-325 mg tablet; Take one tablet at night for pain; may take up to every 6 hours as needed for pain if not working or driving -     naproxen (NAPROSYN) 500 MG tablet; Take 1 tablet (500 mg total) by mouth 2 (two) times daily with meals for 10 days Take with food.  Time out taken, to identify affected area. Area prepped in sterile fashion. Skin injected with 5 ml of 1 % lidocaine  with epinephrine , with good anesthesia. 1.5 cm incision made with sterile #11 scalpel, with return of bloody drainage, and scant purulence. Wound cleansed, and dressed with dry sterile dressing. Patient tolerated procedure well.   Patient Instructions  We have opened your abscess today.  It should continue to drain on its own.  You may also apply warm compresses such as a warm wet washcloth, and perform sitz bath's with shallow warm water with Epsom  salt dissolved.  Take medications as directed. Use hydrocodone with caution; may cause drowsiness. Do not drive or work while taking. Take mostly at night; may use during the day if not working. Return to care should your symptoms not improve, or worsen in any way.   Portions of this note were created using dictation software and may contain typographical errors.   Patient received an After Visit Summary

## 2023-10-27 ENCOUNTER — Ambulatory Visit
Admission: EM | Admit: 2023-10-27 | Discharge: 2023-10-27 | Disposition: A | Discharge status: Home / Self Care | Attending: Emergency Medicine | Admitting: Emergency Medicine

## 2023-10-27 ENCOUNTER — Encounter: Payer: Self-pay | Admitting: Emergency Medicine

## 2023-10-27 DIAGNOSIS — L02416 Cutaneous abscess of left lower limb: Secondary | ICD-10-CM

## 2023-10-27 MED ORDER — DOXYCYCLINE HYCLATE 100 MG PO CAPS: 100.0000 mg | ORAL_CAPSULE | Freq: Two times a day (BID) | ORAL | 0 refills | Status: DC

## 2023-10-27 NOTE — ED Provider Notes (Signed)
 [name]    CSN: [id number] Arrival date & time: 10/27/23  1814      History   Chief Complaint No chief complaint on file.   HPI Alexander Kennedy is a 53 y.o. male.  Patient presents with a painful red sore on his left leg just below his knee x 5 days.  The sore opened and drained pus a couple of days ago.  He denies fever or chills.  Treatment with topical antibiotic.  He reports his last tetanus was 3 years ago.  The history is provided by the patient and medical records.    Past Medical History:  Diagnosis Date   Anxiety    COPD (chronic obstructive pulmonary disease) (HCC)    COVID-19 2020   GERD (gastroesophageal reflux disease)    RARE   History of kidney stones    HTN (hypertension) 10/13/2020   DIET CONTROLLED- NO MEDICATION   Hypertension    Inguinal hernia, bilateral    Pneumonia    PONV (postoperative nausea and vomiting) 06/19/2017   Psoriasis    Sleep apnea    DOES NOT USE CPAP   Tremors of nervous system     Patient Active Problem List   Diagnosis Date Noted   Special screening for malignant neoplasms, colon    Polyp of sigmoid colon    OSA on CPAP 03/27/2018   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   PONV (postoperative nausea and vomiting) 06/19/2017   Back pain 01/20/2017   Psoriasis 01/20/2017   Cigarette nicotine dependence without complication 10/07/2015   Erectile dysfunction 10/07/2015   Essential tremor 10/07/2015    Past Surgical History:  Procedure Laterality Date   BURR HOLE W/ STEREOTACTIC INSERTION OF DBS LEADS / INTRAOP MICROELECTRODE RECORDING     COLONOSCOPY WITH PROPOFOL  N/A 08/27/2020   Procedure: COLONOSCOPY WITH PROPOFOL ;  Surgeon: Janalyn Keene NOVAK, MD;  Location: [facility];  Service: Endoscopy;  Laterality: N/A;   HAND ARTHROSCOPY Left    KNEE SURGERY Left    SHOULDER ARTHROSCOPY WITH OPEN ROTATOR CUFF REPAIR AND DISTAL CLAVICLE ACROMINECTOMY Right 10/17/2019   Procedure: SHOULDER ARTHROSCOPY  WITH OPEN ROTATOR CUFF REPAIR AND biceps tenodysis;  Surgeon: Marchia Drivers, MD;  Location: [facility];  Service: Orthopedics;  Laterality: Right;   SHOULDER ARTHROSCOPY WITH OPEN ROTATOR CUFF REPAIR AND DISTAL CLAVICLE ACROMINECTOMY Right 10/20/2020   Procedure: RIGHT SHOULDER ARTHROSCOPY WITH MINI-OPEN ROTATOR CUFF REPAIR AND DISTAL CLAVICLE ACROMINECTOMY;  Surgeon: Marchia Drivers, MD;  Location: [facility];  Service: Orthopedics;  Laterality: Right;   TRIGGER FINFER RELEASE     ON 2 FINGERS, AND EXPLORATORY SCOPING       Home Medications    Prior to Admission medications   Medication Sig Start Date End Date Taking? Authorizing Provider  doxycycline  (VIBRAMYCIN ) 100 MG capsule Take 1 capsule (100 mg total) by mouth 2 (two) times daily for 7 days. 10/27/23 11/03/23 Yes Corlis Burnard DEL, NP  albuterol  (PROVENTIL  HFA;VENTOLIN  HFA) 108 (90 Base) MCG/ACT inhaler Inhale 1-2 puffs into the lungs every 6 (six) hours as needed for wheezing or shortness of breath. 01/22/17   Sofia, Leslie K, PA-C  escitalopram (LEXAPRO) 10 MG tablet Take 10 mg by mouth daily. 05/26/21   [provider]  gabapentin  (NEURONTIN ) 300 MG capsule Take 2 capsules (600 mg total) by mouth 2 (two) times daily. 07/20/21   Francisca Redell BROCKS, MD  hydrOXYzine (ATARAX/VISTARIL) 25 MG tablet Take 25 mg by mouth 3 (three) times daily as needed for anxiety.  [provider]  ibuprofen  (ADVIL ) 200 MG tablet Take 600 mg by mouth every 8 (eight) hours as needed for moderate pain.    [provider]  Risankizumab -rzaa (SKYRIZI  PEN) 150 MG/ML SOAJ Inject 150 mg into the skin as directed. Every 12 weeks for maintenance. 05/16/22   Hester Alm BROCKS, MD  sildenafil (VIAGRA) 50 MG tablet SMARTSIG:1 Tablet(s) By Mouth 05/26/21   [provider]  terbinafine  (LAMISIL ) 250 MG tablet Take 1 tablet (250 mg total) by mouth daily. 11/07/22   Claudene Lehmann, MD  traZODone (DESYREL) 100 MG tablet Take 100 mg by mouth at  bedtime. 09/16/19   [provider]    Family History Family History  Problem Relation Age of Onset   Congestive Heart Failure Father    Colon cancer Maternal Uncle     Social History Social History   Tobacco Use   Smoking status: Former    Current packs/day: 0.00    Average packs/day: 1 pack/day for 30.0 years (30.0 ttl pk-yrs)    Types: Cigarettes    Start date: 10/02/1989    Quit date: 10/03/2019    Years since quitting: 4.0    Passive exposure: Past   Smokeless tobacco: Never  Vaping Use   Vaping status: Every Day   Start date: 10/11/2020   Substances: Nicotine  Substance Use Topics   Alcohol use: No   Drug use: No     Allergies   Penicillins and Antihistamines, chlorpheniramine-type   Review of Systems Review of Systems  Constitutional:  Negative for chills and fever.  Musculoskeletal:  Positive for gait problem. Negative for joint swelling.  Skin:  Positive for color change and wound.  Neurological:  Negative for weakness and numbness.     Physical Exam Triage Vital Signs ED Triage Vitals  Encounter Vitals Group     BP      Girls Systolic BP Percentile      Girls Diastolic BP Percentile      Boys Systolic BP Percentile      Boys Diastolic BP Percentile      Pulse      Resp      Temp      Temp src      SpO2      Weight      Height      Head Circumference      Peak Flow      Pain Score      Pain Loc      Pain Education      Exclude from Growth Chart    No data found.  Updated Vital Signs BP 122/88   Pulse 83   Temp 98.2 F (36.8 C)   Resp 18   SpO2 99%   Visual Acuity Right Eye Distance:   Left Eye Distance:   Bilateral Distance:    Right Eye Near:   Left Eye Near:    Bilateral Near:     Physical Exam Constitutional:      General: He is not in acute distress. HENT:     Mouth/Throat:     Mouth: Mucous membranes are moist.  Cardiovascular:     Rate and Rhythm: Normal rate and regular rhythm.  Pulmonary:      Effort: Pulmonary effort is normal. No respiratory distress.  Musculoskeletal:        General: No deformity. Normal range of motion.  Skin:    General: Skin is warm and dry.     Findings: Erythema  and lesion present.     Comments: Skin abscess with purulent crusting and erythema on left leg just below left knee.  See picture.  Neurological:     General: No focal deficit present.     Mental Status: He is alert.     Sensory: No sensory deficit.     Motor: No weakness.     Gait: Gait abnormal.     Comments: Limping gait      UC Treatments / Results  Labs (all labs ordered are listed, but only abnormal results are displayed) Labs Reviewed - No data to display  EKG   Radiology No results found.  Procedures Procedures (including critical care time)  Medications Ordered in UC Medications - No data to display  Initial Impression / Assessment and Plan / UC Course  I have reviewed the triage vital signs and the nursing notes.  Pertinent labs & imaging results that were available during my care of the patient were reviewed by me and considered in my medical decision making (see chart for details).    Abscess on left leg.  Afebrile and vital signs are stable.  No I&D indicated at this time as the wound is already draining.  Treating today with doxycycline .  Strict ED precautions discussed with patient at length.  Instructed patient to follow-up with his PCP on Monday.  Education provided on abscess.  Patient reports his tetanus is up-to-date.  He agrees to plan of care.  Final Clinical Impressions(s) / UC Diagnoses   Final diagnoses:  Abscess of left leg     Discharge Instructions      Follow up with your primary care provider tomorrow.  Go to the emergency department if you have worsening symptoms.    Take the doxycycline  as directed.  Keep your wound clean and dry.  Wash it gently twice a day with soap and water.  Then apply an antibiotic cream and bandage.         ED Prescriptions     Medication Sig Dispense Auth. Provider   doxycycline  (VIBRAMYCIN ) 100 MG capsule Take 1 capsule (100 mg total) by mouth 2 (two) times daily for 7 days. 14 capsule Corlis Burnard DEL, NP      PDMP not reviewed this encounter.   Corlis Burnard DEL, NP 10/27/23 [phone]

## 2023-10-27 NOTE — Discharge Instructions (Addendum)
 Follow up with your primary care provider tomorrow.  Go to the emergency department if you have worsening symptoms.    Take the doxycycline as directed.    Keep your wound clean and dry.  Wash it gently twice a day with soap and water.  Then apply an antibiotic cream and bandage.

## 2023-10-29 ENCOUNTER — Emergency Department
Admission: EM | Admit: 2023-10-29 | Discharge: 2023-10-29 | Disposition: A | Discharge status: Home / Self Care | Attending: Emergency Medicine | Admitting: Emergency Medicine

## 2023-10-29 ENCOUNTER — Other Ambulatory Visit: Payer: Self-pay

## 2023-10-29 DIAGNOSIS — I10 Essential (primary) hypertension: Secondary | ICD-10-CM | POA: Insufficient documentation

## 2023-10-29 DIAGNOSIS — L02419 Cutaneous abscess of limb, unspecified: Secondary | ICD-10-CM

## 2023-10-29 DIAGNOSIS — J449 Chronic obstructive pulmonary disease, unspecified: Secondary | ICD-10-CM | POA: Diagnosis not present

## 2023-10-29 DIAGNOSIS — L02416 Cutaneous abscess of left lower limb: Secondary | ICD-10-CM | POA: Diagnosis present

## 2023-10-29 LAB — BASIC METABOLIC PANEL WITH GFR
Anion gap: 11 (ref 5–15)
BUN: 14 mg/dL (ref 6–20)
CO2: 24 mmol/L (ref 22–32)
Calcium: 9 mg/dL (ref 8.9–10.3)
Chloride: 104 mmol/L (ref 98–111)
Creatinine, Ser: 1.03 mg/dL (ref 0.61–1.24)
GFR, Estimated: >60 mL/min (ref >60)
Glucose, Bld: 101 mg/dL — ABNORMAL HIGH (ref 70–99)
Potassium: 4.1 mmol/L (ref 3.5–5.1)
Sodium: 139 mmol/L (ref 135–145)

## 2023-10-29 LAB — CBC
HCT: 39.8 % (ref 39.0–52.0)
Hemoglobin: 13.5 g/dL (ref 13.0–17.0)
MCH: 29.8 pg (ref 26.0–34.0)
MCHC: 33.9 g/dL (ref 30.0–36.0)
MCV: 87.9 fL (ref 80.0–100.0)
Platelets: 148 K/uL — ABNORMAL LOW (ref 150–400)
RBC: 4.53 MIL/uL (ref 4.22–5.81)
RDW: 12.3 % (ref 11.5–15.5)
WBC: 4.6 K/uL (ref 4.0–10.5)
nRBC: 0 % (ref 0.0–0.2)

## 2023-10-29 LAB — LACTIC ACID, PLASMA: Lactic Acid, Venous: 0.8 mmol/L (ref 0.5–1.9)

## 2023-10-29 MED ORDER — HYDROMORPHONE HCL 1 MG/ML IJ SOLN
1.0000 mg | Freq: Once | INTRAMUSCULAR | Status: AC
  Administered 2023-10-29: 1 mg via INTRAVENOUS
  Filled 2023-10-29: qty 1

## 2023-10-29 MED ORDER — CEPHALEXIN 500 MG PO CAPS: 500.0000 mg | ORAL_CAPSULE | Freq: Four times a day (QID) | ORAL | 0 refills | Status: AC

## 2023-10-29 MED ORDER — SULFAMETHOXAZOLE-TRIMETHOPRIM 800-160 MG PO TABS: 1.0000 | ORAL_TABLET | Freq: Two times a day (BID) | ORAL | 0 refills | Status: AC

## 2023-10-29 MED ORDER — CEFAZOLIN SODIUM-DEXTROSE 2-4 GM/100ML-% IV SOLN
2.0000 g | Freq: Once | INTRAVENOUS | Status: AC
  Administered 2023-10-29: 2 g via INTRAVENOUS
  Filled 2023-10-29: qty 100

## 2023-10-29 MED ORDER — LIDOCAINE HCL (PF) 1 % IJ SOLN
10.0000 mL | Freq: Once | INTRAMUSCULAR | Status: AC
  Administered 2023-10-29: 10 mL
  Filled 2023-10-29: qty 10

## 2023-10-29 NOTE — ED Provider Notes (Signed)
 [facility] Provider Note    Event Date/Time   First MD Initiated Contact with Patient 10/29/23 [phone]     (approximate)   History   Knee Pain   HPI  Alexander Kennedy is a 53 y.o. male with a history of COPD, GERD, hypertension, and sleep apnea who presents with a boil to his left lower leg just below the knee over the last several days.  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  It then swelled up more and became a boil.  It started draining pus.  He went to urgent care 2 days ago.  He was started on an antibiotic.  However he states that over the last couple of days the redness and swelling has increased.  He also has had some generalized chills.  He denies any fever.  I reviewed the past medical records.  The patient was seen at urgent care on 9/5 with at that time a sore on his leg with a small area of erythema around it.  It was draining on its own so no I&D was indicated, and the patient was started on doxycycline .   Physical Exam   Triage Vital Signs: ED Triage Vitals [10/29/23 0906]  Encounter Vitals Group     BP 133/89     Girls Systolic BP Percentile      Girls Diastolic BP Percentile      Boys Systolic BP Percentile      Boys Diastolic BP Percentile      Pulse Rate 85     Resp 18     Temp 98 F (36.7 C)     Temp src      SpO2 98 %     Weight      Height      Head Circumference      Peak Flow      Pain Score 4     Pain Loc      Pain Education      Exclude from Growth Chart     Most recent vital signs: Vitals:   10/29/23 0906  BP: 133/89  Pulse: 85  Resp: 18  Temp: 98 F (36.7 C)  SpO2: 98%     General: Awake, no distress.  CV:  Good peripheral perfusion.  Resp:  Normal effort.  Abd:  No distention.  Other:  Left lower leg just distal to the knee with an approximately 3 x 5 cm fluctuant mass with some purulent drainage and approximately 5 cm surrounding erythema  and induration.  Full range of motion at the knee.   ED Results / Procedures / Treatments   Labs (all labs ordered are listed, but only abnormal results are displayed) Labs Reviewed  CBC - Abnormal; Notable for the following components:      Result Value   Platelets 148 (*)    All other components within normal limits  BASIC METABOLIC PANEL WITH GFR - Abnormal; Notable for the following components:   Glucose, Bld 101 (*)    All other components within normal limits  LACTIC ACID, PLASMA     EKG    RADIOLOGY   PROCEDURES:  Critical Care performed: No  .Incision and Drainage  Date/Time: 10/29/2023 4:11 PM  Performed by: Jacolyn Pae, MD Authorized by: Jacolyn Pae, MD   Consent:    Consent obtained:  Verbal   Consent given by:  Patient   Risks discussed:  Bleeding, infection, incomplete drainage and pain   Alternatives discussed:  Alternative treatment, delayed treatment and observation Universal protocol:    Patient identity confirmed:  Verbally with patient Location:    Type:  Abscess   Location:  Lower extremity   Lower extremity location:  Leg   Leg location:  L lower leg Sedation:    Sedation type:  None Anesthesia:    Anesthesia method:  Local infiltration   Local anesthetic:  Lidocaine  1% w/o epi Procedure type:    Complexity:  Simple Procedure details:    Incision types:  Single straight   Drainage:  Purulent   Drainage amount:  Moderate   Wound treatment:  Wound left open   Packing materials:  None Post-procedure details:    Procedure completion:  Tolerated well, no immediate complications    MEDICATIONS ORDERED IN ED: Medications  HYDROmorphone  (DILAUDID ) injection 1 mg (1 mg Intravenous Given 10/29/23 0939)  lidocaine  (PF) (XYLOCAINE ) 1 % injection 10 mL (10 mLs Other Given by Other 10/29/23 1024)  ceFAZolin  (ANCEF ) IVPB 2g/100 mL premix (0 g Intravenous Stopped 10/29/23 1023)     IMPRESSION / MDM / ASSESSMENT AND PLAN / ED  COURSE  I reviewed the triage vital signs and the nursing notes.  53 year old male with PMH as noted above presents with an area of redness and swelling to the left leg.  Differential diagnosis includes, but is not limited to, abscess, cellulitis.  It appears cutaneous; there is no evidence of involvement in the knee joint.  We will obtain labs, attempt I&D given that it has increased in size and there is a fluctuant mass, and give a dose of IV antibiotics.  Patient's presentation is most consistent with acute presentation with potential threat to life or bodily function.  ----------------------------------------- 10:53 AM on 10/29/2023 -----------------------------------------  I&D was performed successfully with a good return purulent fluid.  Lab workup is reassuring.  He has no leukocytosis and her lactate is normal.  At this time, the patient is stable for discharge home.  I instructed him to discontinue the doxycycline  and start Keflex  and Bactrim  for MRSA coverage.  He reports a few other small boils on his lower abdomen and side which all appear to be mild folliculitis with no fluctuant masses.  I gave strict return precautions, and he expresses understanding.   FINAL CLINICAL IMPRESSION(S) / ED DIAGNOSES   Final diagnoses:  Leg abscess     Rx / DC Orders   ED Discharge Orders          Ordered    cephALEXin  (KEFLEX ) 500 MG capsule  4 times daily        10/29/23 1054    sulfamethoxazole -trimethoprim  (BACTRIM  DS) 800-160 MG tablet  2 times daily        10/29/23 1054             Note:  This document was prepared using Dragon voice recognition software and may include unintentional dictation errors.    Jacolyn Pae, MD 10/29/23 [phone]

## 2023-10-29 NOTE — ED Triage Notes (Signed)
 Pt to ED via POV from home. Pt reports left knee pain from infection. Pt placed on antibiotics. Pt reports his wife feels it is getting worse. Started at a boil.

## 2023-10-29 NOTE — Discharge Instructions (Addendum)
 Discontinue the doxycycline  they were prescribed a few days ago and start the 2 new antibiotics that were prescribed today.  Return to the ER for new, worsening, or persistent severe pain, swelling, redness, pus drainage, difficulty bending your knee, increase in size or redness of the other boils, fever or chills, or any other new or worsening symptoms that concern you.

## 2023-11-13 ENCOUNTER — Ambulatory Visit (INDEPENDENT_AMBULATORY_CARE_PROVIDER_SITE_OTHER): Payer: Medicaid Other | Admitting: Dermatology

## 2023-11-13 ENCOUNTER — Encounter: Payer: Self-pay | Admitting: Dermatology

## 2023-11-13 DIAGNOSIS — L578 Other skin changes due to chronic exposure to nonionizing radiation: Secondary | ICD-10-CM

## 2023-11-13 DIAGNOSIS — Z1283 Encounter for screening for malignant neoplasm of skin: Secondary | ICD-10-CM | POA: Diagnosis not present

## 2023-11-13 DIAGNOSIS — W908XXA Exposure to other nonionizing radiation, initial encounter: Secondary | ICD-10-CM

## 2023-11-13 DIAGNOSIS — L814 Other melanin hyperpigmentation: Secondary | ICD-10-CM

## 2023-11-13 DIAGNOSIS — L409 Psoriasis, unspecified: Secondary | ICD-10-CM

## 2023-11-13 DIAGNOSIS — Z872 Personal history of diseases of the skin and subcutaneous tissue: Secondary | ICD-10-CM

## 2023-11-13 DIAGNOSIS — D229 Melanocytic nevi, unspecified: Secondary | ICD-10-CM

## 2023-11-13 DIAGNOSIS — Z8619 Personal history of other infectious and parasitic diseases: Secondary | ICD-10-CM

## 2023-11-13 DIAGNOSIS — L821 Other seborrheic keratosis: Secondary | ICD-10-CM | POA: Diagnosis not present

## 2023-11-13 DIAGNOSIS — D1801 Hemangioma of skin and subcutaneous tissue: Secondary | ICD-10-CM

## 2023-11-13 DIAGNOSIS — Z7189 Other specified counseling: Secondary | ICD-10-CM

## 2023-11-13 DIAGNOSIS — Z79899 Other long term (current) drug therapy: Secondary | ICD-10-CM

## 2023-11-13 MED ORDER — MUPIROCIN 2 % EX OINT: 1.0000 | TOPICAL_OINTMENT | Freq: Two times a day (BID) | CUTANEOUS | 0 refills | Status: AC

## 2023-11-13 NOTE — Patient Instructions (Addendum)
 Staphylococcus decolonization Mupirocin  instructions: Apply to nostrils, around ears, armpits, belly button twice per day for 7 days. For nostrils: put ointment on one end of a cotton-tip swab (Q-tips). Apply the medicine all around the inside of one nostril. Then, put ointment on the other unused end of the cotton-tip swab and apply inside the other nostril. Press nostrils together and massage gently for 60 seconds.  Hibiclens  wash  Wash entire body with hibiclens  once per day in the shower for two weeks   Recommend daily broad spectrum sunscreen SPF 30+ to sun-exposed areas, reapply every 2 hours as needed. Call for new or changing lesions.  Staying in the shade or wearing long sleeves, sun glasses (UVA+UVB protection) and wide brim hats (4-inch brim around the entire circumference of the hat) are also recommended for sun protection.     Melanoma ABCDEs  Melanoma is the most dangerous type of skin cancer, and is the leading cause of death from skin disease.  You are more likely to develop melanoma if you: Have light-colored skin, light-colored eyes, or red or blond hair Spend a lot of time in the sun Tan regularly, either outdoors or in a tanning bed Have had blistering sunburns, especially during childhood Have a close family member who has had a melanoma Have atypical moles or large birthmarks  Early detection of melanoma is key since treatment is typically straightforward and cure rates are extremely high if we catch it early.   The first sign of melanoma is often a change in a mole or a new dark spot.  The ABCDE system is a way of remembering the signs of melanoma.  A for asymmetry:  The two halves do not match. B for border:  The edges of the growth are irregular. C for color:  A mixture of colors are present instead of an even brown color. D for diameter:  Melanomas are usually (but not always) greater than 6mm - the size of a pencil eraser. E for evolution:  The spot keeps  changing in size, shape, and color.  Please check your skin once per month between visits. You can use a small mirror in front and a large mirror behind you to keep an eye on the back side or your body.   If you see any new or changing lesions before your next follow-up, please call to schedule a visit.  Please continue daily skin protection including broad spectrum sunscreen SPF 30+ to sun-exposed areas, reapplying every 2 hours as needed when you're outdoors.     Due to recent changes in healthcare laws, you may see results of your pathology and/or laboratory studies on MyChart before the doctors have had a chance to review them. We understand that in some cases there may be results that are confusing or concerning to you. Please understand that not all results are received at the same time and often the doctors may need to interpret multiple results in order to provide you with the best plan of care or course of treatment. Therefore, we ask that you please give us  2 business days to thoroughly review all your results before contacting the office for clarification. Should we see a critical lab result, you will be contacted sooner.   If You Need Anything After Your Visit  If you have any questions or concerns for your doctor, please call our main line at [phone] and press option 4 to reach your doctor's medical assistant. If no one answers, please leave a voicemail  as directed and we will return your call as soon as possible. Messages left after 4 pm will be answered the following business day.   You may also send us  a message via MyChart. We typically respond to MyChart messages within 1-2 business days.  For prescription refills, please ask your pharmacy to contact our office. Our fax number is [phone].  If you have an urgent issue when the clinic is closed that cannot wait until the next business day, you can page your doctor at the number below.    Please note that while we do  our best to be available for urgent issues outside of office hours, we are not available 24/7.   If you have an urgent issue and are unable to reach us , you may choose to seek medical care at your doctor's office, retail clinic, urgent care center, or emergency room.  If you have a medical emergency, please immediately call 911 or go to the emergency department.  Pager Numbers  - Dr. Hester: [phone]  - Dr. Jackquline: [phone]  - Dr. Claudene: [phone]   - Dr. Raymund: [phone]  In the event of inclement weather, please call our main line at [phone] for an update on the status of any delays or closures.  Dermatology Medication Tips: Please keep the boxes that topical medications come in in order to help keep track of the instructions about where and how to use these. Pharmacies typically print the medication instructions only on the boxes and not directly on the medication tubes.   If your medication is too expensive, please contact our office at [phone] option 4 or send us  a message through MyChart.   We are unable to tell what your co-pay for medications will be in advance as this is different depending on your insurance coverage. However, we may be able to find a substitute medication at lower cost or fill out paperwork to get insurance to cover a needed medication.   If a prior authorization is required to get your medication covered by your insurance company, please allow us  1-2 business days to complete this process.  Drug prices often vary depending on where the prescription is filled and some pharmacies may offer cheaper prices.  The website www.goodrx.com contains coupons for medications through different pharmacies. The prices here do not account for what the cost may be with help from insurance (it may be cheaper with your insurance), but the website can give you the price if you did not use any insurance.  - You can print the associated coupon and take  it with your prescription to the pharmacy.  - You may also stop by our office during regular business hours and pick up a GoodRx coupon card.  - If you need your prescription sent electronically to a different pharmacy, notify our office through [facility] or by phone at [phone] option 4.     Si Usted Necesita Algo Despus de Su Visita  Tambin puede enviarnos un mensaje a travs de Clinical cytogeneticist. Por lo general respondemos a los mensajes de MyChart en el transcurso de 1 a 2 das hbiles.  Para renovar recetas, por favor pida a su farmacia que se ponga en contacto con nuestra oficina. Randi lakes de fax es [place] [phone].  Si tiene un asunto urgente cuando la clnica est cerrada y que no puede esperar hasta el siguiente da hbil, puede llamar/localizar a su doctor(a) al nmero que aparece a continuacin.   Por favor, tenga en cuenta  que aunque hacemos todo lo posible para estar disponibles para asuntos urgentes fuera del horario de [place], no estamos disponibles las 24 horas del da, los 7 [address]  [address] de la [place].   Si tiene un problema urgente y no puede comunicarse con nosotros, puede optar por buscar atencin mdica  en el consultorio de su doctor(a), en una clnica privada, en un centro de atencin urgente o en una sala de emergencias.  Si tiene Engineer, drilling, por favor llame inmediatamente al 911 o vaya a la sala de emergencias.  Nmeros de bper  - Dr. Hester: [phone]  - Dra. Jackquline: [phone]  - Dr. Claudene: [phone]  - Dra. Kitts: [phone]  En caso de inclemencias del [place], por favor llame a nuestra lnea principal al [phone] para una actualizacin sobre el estado de cualquier retraso o cierre.  Consejos para la medicacin en dermatologa: Por favor, guarde las cajas en las que vienen los medicamentos de uso tpico para ayudarle a seguir las instrucciones sobre dnde y cmo usarlos. Las farmacias generalmente imprimen las  instrucciones del medicamento slo en las cajas y no directamente en los tubos del [place].   Si su medicamento es muy caro, por favor, pngase en contacto con landry rieger llamando al [phone] y presione la opcin 4 o envenos un mensaje a travs de Clinical cytogeneticist.   No podemos decirle cul ser su copago por los medicamentos por adelantado ya que esto es diferente dependiendo de la cobertura de su seguro. Sin embargo, es posible que podamos encontrar un medicamento sustituto a Audiological scientist un formulario para que el seguro cubra el medicamento que se considera necesario.   Si se requiere una autorizacin previa para que su compaa de seguros malta su medicamento, por favor permtanos de 1 a 2 das hbiles para completar este proceso.  Los precios de los medicamentos varan con frecuencia dependiendo del Environmental consultant de dnde se surte la receta y alguna farmacias pueden ofrecer precios ms baratos.  El sitio web www.goodrx.com tiene cupones para medicamentos de Health and safety inspector. Los precios aqu no tienen en cuenta lo que podra costar con la ayuda del seguro (puede ser ms barato con su seguro), pero el sitio web puede darle el precio si no utiliz Tourist information centre manager.  - Puede imprimir el cupn correspondiente y llevarlo con su receta a la farmacia.  - Tambin puede pasar por nuestra oficina durante el horario de atencin regular y Education officer, museum una tarjeta de cupones de GoodRx.  - Si necesita que su receta se enve electrnicamente a una farmacia diferente, informe a nuestra oficina a travs de MyChart de [place] o por telfono llamando al [phone] y presione la opcin 4.

## 2023-11-13 NOTE — Progress Notes (Signed)
 Follow-Up Visit   Subjective  Alexander Kennedy is a 53 y.o. male who presents for the following: Skin Cancer Screening and Full Body Skin Exam; no hx of skin cancer. No areas of concern today. Patient states that he needs a refill of Skyrizi . Patient also states that he has had multiple abscesses over the past month; he was recently hospitalized for sepsis due to an abscess on his left knee.   The patient presents for Total-Body Skin Exam (TBSE) for skin cancer screening and mole check. The patient has spots, moles and lesions to be evaluated, some may be new or changing and the patient may have concern these could be cancer.    The following portions of the chart were reviewed this encounter and updated as appropriate: medications, allergies, medical history  Review of Systems:  No other skin or systemic complaints except as noted in HPI or Assessment and Plan.  Objective  Well appearing patient in no apparent distress; mood and affect are within normal limits.  A full examination was performed including scalp, head, eyes, ears, nose, lips, neck, chest, axillae, abdomen, back, buttocks, bilateral upper extremities, bilateral lower extremities, hands, feet, fingers, toes, fingernails, and toenails. All findings within normal limits unless otherwise noted below.   Relevant physical exam findings are noted in the Assessment and Plan.    Assessment & Plan   SKIN CANCER SCREENING PERFORMED TODAY.  ACTINIC DAMAGE - Chronic condition, secondary to cumulative UV/sun exposure - diffuse scaly erythematous macules with underlying dyspigmentation - Recommend daily broad spectrum sunscreen SPF 30+ to sun-exposed areas, reapply every 2 hours as needed.  - Staying in the shade or wearing long sleeves, sun glasses (UVA+UVB protection) and wide brim hats (4-inch brim around the entire circumference of the hat) are also recommended for sun protection.  - Call for new or changing  lesions.  LENTIGINES, SEBORRHEIC KERATOSES, HEMANGIOMAS - Benign normal skin lesions - Benign-appearing - Call for any changes  MELANOCYTIC NEVI - Tan-brown and/or pink-flesh-colored symmetric macules and papules - Benign appearing on exam today - Observation - Call clinic for new or changing moles - Recommend daily use of broad spectrum spf 30+ sunscreen to sun-exposed areas.   PSORIASIS on Systemic Treatment Exam: Well-demarcated erythematous papules/plaques with silvery scale lower legs. 2% BSA.  Chronic and persistent condition with duration or expected duration over one year. Condition is bothersome/symptomatic for patient. Currently flared.   patient denies joint pain  Will order Quantiferon Gold for yearly update.   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  Psoriasis is a chronic non-curable, but treatable genetic/hereditary disease that may have other systemic features affecting other organ systems such as joints (Psoriatic Arthritis).  It is linked with heart disease, inflammatory bowel disease, non-alcoholic fatty liver disease, and depression. Significant skin psoriasis and/or psoriatic arthritis may have significant symptoms and affects activities of daily activity and often benefits from systemic treatments.  These systemic treatments have some potential side effects including immunosuppression and require pre-treatment laboratory screening and periodic laboratory monitoring and periodic in person evaluation and monitoring by the attending dermatologist physician (long term medication management).    Treatment Plan: Due to recent Sepsis infection patient will hold Skyrizi ; patient will call back after completing staph decolonization Repeat quant gold  Reviewed risks of biologics including immunosuppression, infections (i.e. TB reactivation), injection site reaction, and failure to improve condition. Goal is control of skin condition, not cure.  Some older biologics such as  Humira and Enbrel may slightly increase risk  of malignancy and may worsen congestive heart failure.  Taltz, Cosentyx, and Bimzelx may cause inflammatory bowel disease to flare or may increase incidence of yeast infections. Skyrizi , Tremfya, and Stelara may also slightly increase risk of infection. The use of biologics requires long term medication management, including periodic office visits, annual TB screening test and monitoring of blood work.  Monitoring for Tuberculosis Infection:  Patient is currently receiving biologic therapy for psoriasis, which carries a potential risk for reactivation of tuberculosis infection. As part of ongoing safety monitoring, a QuantiFERON-TB Gold (QFT-G) test is checked prior to initiation of biologic therapy and yearly per guidelines. - Most recent QFT-G result:     Latest Ref Rng & Units 11/07/2022    9:48 AM  Quantiferon TB Gold  Quantiferon TB Gold Plus Negative Negative     - Will recheck yearly  - No signs or symptoms of active TB noted. - Will continue to monitor per standard biologic safety protocol. - If QFT-G becomes positive or patient develops symptoms suggestive of TB, appropriate evaluation will be initiated  Long term medication management.  Patient is using long term (months to years) prescription medication  to control their dermatologic condition.  These medications require periodic monitoring to evaluate for efficacy and side effects and may require periodic laboratory monitoring.    Hx of Abscess of Left knee, suspect MRSA infection, no culture visible in Epic - Was evaluated in the ED and treated for sepsis on 10/29/23; no culture was completed. - Instructed to use OTC Hibiclens ; Wash entire body with hibiclens  once per day in the shower for two weeks - Opening of the nostril, around the ears, under the arms, and in the belly button twice daily for 7 days  LONG-TERM USE OF HIGH-RISK MEDICATION   Related Procedures QuantiFERON-TB Gold  Plus PSORIASIS   Related Medications Risankizumab -rzaa (SKYRIZI  PEN) 150 MG/ML SOAJ Inject 150 mg into the skin as directed. Every 12 weeks for maintenance. COUNSELING AND COORDINATION OF CARE   MEDICATION MANAGEMENT   MULTIPLE BENIGN NEVI   LENTIGINES   ACTINIC ELASTOSIS   SEBORRHEIC KERATOSES   HISTORY OF STAPH INFECTION   Related Medications mupirocin  ointment (BACTROBAN ) 2 % Apply 1 Application topically 2 (two) times daily. Apply to nostrils, around ears, armpits, belly button twice per day for 7 days. For nostrils: put ointment on one end of a cotton-tip swab (Q-tips). Apply the medicine all around the inside of one nostril. Then, put ointment on the other unused end of the cotton-tip swab and apply inside the other nostril. Press nostrils together and massage gently for 60 seconds. Return in about 1 year (around 11/12/2024) for TBSE, Psoriasis.  I, Emerick Ege, CMA am acting as scribe for Boneta Sharps, MD.   Documentation: I have reviewed the above documentation for accuracy and completeness, and I agree with the above.  Boneta Sharps, MD

## 2023-11-24 ENCOUNTER — Ambulatory Visit

## 2023-11-24 VITALS — BP 103/76 | HR 82 | Resp 16 | Ht 67.0 in | Wt 223.1 lb

## 2023-11-24 DIAGNOSIS — J449 Chronic obstructive pulmonary disease, unspecified: Secondary | ICD-10-CM

## 2023-11-24 DIAGNOSIS — Z23 Encounter for immunization: Secondary | ICD-10-CM

## 2023-11-24 DIAGNOSIS — L0231 Cutaneous abscess of buttock: Secondary | ICD-10-CM | POA: Diagnosis not present

## 2023-11-24 DIAGNOSIS — F5101 Primary insomnia: Secondary | ICD-10-CM | POA: Diagnosis not present

## 2023-11-24 DIAGNOSIS — L409 Psoriasis, unspecified: Secondary | ICD-10-CM

## 2023-11-24 DIAGNOSIS — G250 Essential tremor: Secondary | ICD-10-CM

## 2023-11-24 DIAGNOSIS — L03317 Cellulitis of buttock: Secondary | ICD-10-CM | POA: Diagnosis not present

## 2023-11-24 MED ORDER — DOXYCYCLINE HYCLATE 100 MG PO TABS: 100.0000 mg | ORAL_TABLET | Freq: Two times a day (BID) | ORAL | 0 refills | Status: AC

## 2023-11-24 MED ORDER — ALBUTEROL SULFATE HFA 108 (90 BASE) MCG/ACT IN AERS: 1.0000 | INHALATION_SPRAY | Freq: Four times a day (QID) | RESPIRATORY_TRACT | Status: AC | PRN

## 2023-11-24 MED ORDER — CEPHALEXIN 500 MG PO CAPS: 500.0000 mg | ORAL_CAPSULE | Freq: Two times a day (BID) | ORAL | 0 refills | Status: AC

## 2023-11-24 MED ORDER — TRAZODONE HCL 100 MG PO TABS: 200.0000 mg | ORAL_TABLET | Freq: Every day | ORAL | Status: AC

## 2023-11-24 MED ORDER — LIDOCAINE-EPINEPHRINE (PF) 1 %-1:200000 IJ SOLN
5.0000 mL | Freq: Once | INTRAMUSCULAR | Status: AC
  Administered 2023-11-24: 5 mL via INTRADERMAL

## 2023-11-24 NOTE — Assessment & Plan Note (Signed)
 Chronic, controlled. Uses albuterol  PRN. Vapes nicotine currently. Previously smoked cigarettes with a 30 pack year hx. Would be a candidate for lung cancer screening. Will discuss at next visit.

## 2023-11-24 NOTE — Assessment & Plan Note (Signed)
 Chronic, uncontrolled. S/p deep brain stimulator placed at [facility] in 2019. Recommend follow up with [facility] given worsening tremors.

## 2023-11-24 NOTE — Assessment & Plan Note (Signed)
 Patient with 4 day hx of abscess on R buttocks. On exam, 4x4cm fluctuant mass with surrounding erythema. Performed I&D as detailed in procedure note. Prescribed 7 day course of doxycycline  and Keflex . Of note, patient has had recurrent abscesses in groin area. May be 2/2 immunosuppressive medication vs. HS. Recommend follow up with dermatology.

## 2023-11-24 NOTE — Patient Instructions (Signed)
 I value your feedback, so if you receive a survey, please take the time to fill it out. Thank you for choosing [facility]!

## 2023-11-24 NOTE — Progress Notes (Signed)
 New patient visit   Patient: Alexander Kennedy   DOB: 1970/07/31   53 y.o. Male  MRN: [id number] Visit Date: 11/24/2023  Today's healthcare provider: Isaiah DELENA Pepper, MD   Chief Complaint  Patient presents with   New Patient (Initial Visit)    NP/Est Care/ boil bottom ( since Monday)   Subjective    Alexander Kennedy is a 53 y.o. male who presents today as a new patient to establish care and for abscess drainage.  Abscess: - located on R butt cheek - has been there since Monday - has not been draining - Seen in ED for boil on L knee in early September - Seen in urgent care for boil inside butt cheek in July  Insomnia- takes trazodone which helps somewhat  Psoriasis- takes Skyrizi , follows with derm, has not been taking it for some time since   COPD- albuterol  PRN, vapes nicotine   Essential temor- has a deep brain stimulator, done at [facility]  Past Medical History:  Diagnosis Date   Anxiety    COPD (chronic obstructive pulmonary disease) (HCC)    COVID-19 2020   GERD (gastroesophageal reflux disease)    RARE   History of kidney stones    HTN (hypertension) 10/13/2020   DIET CONTROLLED- NO MEDICATION   Hypertension    Inguinal hernia, bilateral    Pneumonia    PONV (postoperative nausea and vomiting) 06/19/2017   Psoriasis    Sleep apnea    DOES NOT USE CPAP   Tremors of nervous system    Past Surgical History:  Procedure Laterality Date   BURR HOLE W/ STEREOTACTIC INSERTION OF DBS LEADS / INTRAOP MICROELECTRODE RECORDING     COLONOSCOPY WITH PROPOFOL  N/A 08/27/2020   Procedure: COLONOSCOPY WITH PROPOFOL ;  Surgeon: Janalyn Keene NOVAK, MD;  Location: [facility];  Service: Endoscopy;  Laterality: N/A;   HAND ARTHROSCOPY Left    KNEE SURGERY Left    SHOULDER ARTHROSCOPY WITH OPEN ROTATOR CUFF REPAIR AND DISTAL CLAVICLE ACROMINECTOMY Right 10/17/2019   Procedure: SHOULDER ARTHROSCOPY WITH OPEN ROTATOR CUFF REPAIR AND biceps tenodysis;  Surgeon: Marchia Drivers, MD;  Location: [facility];  Service: Orthopedics;  Laterality: Right;   SHOULDER ARTHROSCOPY WITH OPEN ROTATOR CUFF REPAIR AND DISTAL CLAVICLE ACROMINECTOMY Right 10/20/2020   Procedure: RIGHT SHOULDER ARTHROSCOPY WITH MINI-OPEN ROTATOR CUFF REPAIR AND DISTAL CLAVICLE ACROMINECTOMY;  Surgeon: Marchia Drivers, MD;  Location: [facility];  Service: Orthopedics;  Laterality: Right;   TRIGGER FINFER RELEASE     ON 2 FINGERS, AND EXPLORATORY SCOPING   Family Status  Relation Name Status   Mother  Alive   Father  Deceased   Mat Uncle  Deceased   Mat Uncle  Deceased  No partnership data on file   Family History  Problem Relation Age of Onset   Congestive Heart Failure Father    Colon cancer Maternal Uncle    Social History   Socioeconomic History   Marital status: Married    Spouse name: Not on file   Number of children: 1   Years of education: Not on file   Highest education level: Not on file  Occupational History   Not on file  Tobacco Use   Smoking status: Former    Current packs/day: 0.00    Average packs/day: 1 pack/day for 30.0 years (30.0 ttl pk-yrs)    Types: Cigarettes    Start date: 10/02/1989    Quit date: 10/03/2019    Years since quitting: 4.1  Passive exposure: Past   Smokeless tobacco: Never  Vaping Use   Vaping status: Every Day   Start date: 10/11/2020   Substances: Nicotine  Substance and Sexual Activity   Alcohol use: No   Drug use: No   Sexual activity: Not on file  Other Topics Concern   Not on file  Social History Narrative   Patient lives at home WITH WIFE AND GRANDSON   Patient is right handed.    Social Drivers of Corporate investment banker Strain: Not on file  Food Insecurity: Not on file  Transportation Needs: Not on file  Physical Activity: Not on file  Stress: Not on file  Social Connections: Unknown (07/05/2021)   Received from [facility]   Social Network    Social Network: Not on file   Outpatient Medications Prior to Visit   Medication Sig   ibuprofen  (ADVIL ) 200 MG tablet Take 600 mg by mouth every 8 (eight) hours as needed for moderate pain.   mupirocin  ointment (BACTROBAN ) 2 % Apply 1 Application topically 2 (two) times daily. Apply to nostrils, around ears, armpits, belly button twice per day for 7 days. For nostrils: put ointment on one end of a cotton-tip swab (Q-tips). Apply the medicine all around the inside of one nostril. Then, put ointment on the other unused end of the cotton-tip swab and apply inside the other nostril. Press nostrils together and massage gently for 60 seconds.   Risankizumab -rzaa (SKYRIZI  PEN) 150 MG/ML SOAJ Inject 150 mg into the skin as directed. Every 12 weeks for maintenance.   [DISCONTINUED] albuterol  (PROVENTIL  HFA;VENTOLIN  HFA) 108 (90 Base) MCG/ACT inhaler Inhale 1-2 puffs into the lungs every 6 (six) hours as needed for wheezing or shortness of breath.   [DISCONTINUED] sildenafil (VIAGRA) 50 MG tablet SMARTSIG:1 Tablet(s) By Mouth   [DISCONTINUED] terbinafine  (LAMISIL ) 250 MG tablet Take 1 tablet (250 mg total) by mouth daily.   [DISCONTINUED] traZODone (DESYREL) 100 MG tablet Take 100 mg by mouth at bedtime.   [DISCONTINUED] escitalopram (LEXAPRO) 10 MG tablet Take 10 mg by mouth daily. (Patient not taking: Reported on 11/24/2023)   [DISCONTINUED] gabapentin  (NEURONTIN ) 300 MG capsule Take 2 capsules (600 mg total) by mouth 2 (two) times daily. (Patient not taking: Reported on 11/24/2023)   [DISCONTINUED] hydrOXYzine (ATARAX/VISTARIL) 25 MG tablet Take 25 mg by mouth 3 (three) times daily as needed for anxiety. (Patient not taking: Reported on 11/24/2023)   No facility-administered medications prior to visit.   Allergies  Allergen Reactions   Penicillins Itching, Shortness Of Breath and Other (See Comments)    Other: Dizziness  Tolerated 1st generation cephalosporin (CEFAZOLIN ) on 10/17/2019 without ADRs.  PCN reaction causing immediate rash, facial/tongue/throat swelling, SOB  or lightheadedness with hypotension: Yes  PCN reaction causing severe rash involving mucus membranes or skin necrosis: No  PCN reaction that required hospitalization: No  PCN reaction occurring within the last 10 years: No  If all of the above answers are NO, then may proceed with Cephalosporin use.  Other reaction(s): Dizziness, Other (See Comments), Unknown  Dizziness also  Has patient had a PCN reaction causing immediate rash, facial/tongue/throat swelling, SOB or lightheadedness with hypotension: Yes  Has patient had a PCN reaction causing severe rash involving mucus membranes or skin necrosis: No  Has patient had a PCN reaction that required hospitalization: No  Has patient had a PCN reaction occurring within the last 10 years: No  Has patient had a PCN reaction causing immediate rash, facial/tongue/throat swelling, SOB  or lighth... (TRUNCATED)  Dizziness also, Has patient had a PCN reaction causing immediate rash, facial/tongue/throat swelling, SOB or lightheadedness with hypotension: Yes, Has patient had a PCN reaction causing severe rash involving mucus membranes or skin necrosis: No, Has patient had a PCN reaction that required hospitalization: No, Has patient had a PCN reaction occurring within the last 10 years: No, If all of the above answers are NO, then may proceed with Cephalosporin use.   Antihistamines, Chlorpheniramine-Type     Other reaction(s): Other (See Comments) sneeze    Reviews of Systems as noted in HPI.      Objective    BP 103/76 (BP Location: Right Arm, Patient Position: Sitting, Cuff Size: Normal)   Pulse 82   Resp 16   Ht 5' 7 (1.702 m)   Wt 223 lb 1.6 oz (101.2 kg)   SpO2 100%   BMI 34.94 kg/m     Physical Exam Constitutional:      Appearance: Normal appearance.  HENT:     Head: Normocephalic and atraumatic.     Mouth/Throat:     Mouth: Mucous membranes are moist.  Eyes:     Pupils: Pupils are equal, round, and reactive to  light.  Pulmonary:     Effort: Pulmonary effort is normal.  Skin:    General: Skin is warm.     Comments: 4x4cm abscess present on R butt cheek, with surrounding erythema and TTP  Neurological:     General: No focal deficit present.     Mental Status: He is alert.     Depression Screen    11/24/2023    4:09 PM  PHQ 2/9 Scores  PHQ - 2 Score 0  PHQ- 9 Score 0   No results found for any visits on 11/24/23.  Assessment & Plan      Problem List Items Addressed This Visit       Respiratory   Chronic obstructive pulmonary disease (HCC)   Chronic, controlled. Uses albuterol  PRN. Vapes nicotine currently. Previously smoked cigarettes with a 30 pack year hx. Would be a candidate for lung cancer screening. Will discuss at next visit.      Relevant Medications   albuterol  (VENTOLIN  HFA) 108 (90 Base) MCG/ACT inhaler     Nervous and Auditory   Essential tremor   Chronic, uncontrolled. S/p deep brain stimulator placed at [facility] in 2019. Recommend follow up with [facility] given worsening tremors.        Musculoskeletal and Integument   Psoriasis   Chronic, controlled. Follows with dermatology. Was previously taking Skyrizi  but has not been on it due to recurrent abscesses. Recommend follow up with dermatology.        Other   Primary insomnia   Chronic, uncontrolled. Currently taking 200mg  trazodone nightly with some improvement, however still has trouble falling asleep. Tried Atarax previously but made patient too groggy. - Discussed sleep hygiene techniques - Continue trazodone - Recommend taking melatonin with trazodone at night - Will follow up at next visit in 2 weeks      Relevant Medications   traZODone (DESYREL) 100 MG tablet   Cellulitis and abscess of buttock - Primary   Patient with 4 day hx of abscess on R buttocks. On exam, 4x4cm fluctuant mass with surrounding erythema. Performed I&D as detailed in procedure note. Prescribed 7 day course of doxycycline  and  Keflex . Of note, patient has had recurrent abscesses in groin area. May be 2/2 immunosuppressive medication vs. HS. Recommend follow up with dermatology.  Relevant Medications   doxycycline  (VIBRA -TABS) 100 MG tablet   cephALEXin  (KEFLEX ) 500 MG capsule   Other Relevant Orders   I & D   Other Visit Diagnoses       Need for vaccination       Relevant Orders   Flu vaccine trivalent PF, 6mos and older(Flulaval,Afluria,Fluarix,Fluzone) (Completed)       Return in about 2 weeks (around 12/08/2023) for Follow Up Abscess.      Isaiah DELENA Pepper, MD  [facility] [phone] (phone) [phone] (fax)

## 2023-11-24 NOTE — Progress Notes (Signed)
 I & D  Date/Time: 11/24/2023 4:08 PM  Performed by: Franchot Isaiah LABOR, MD Authorized by: Franchot Isaiah LABOR, MD   Consent:    Consent obtained:  Written   Consent given by:  Patient   Risks, benefits, and alternatives were discussed: yes     Risks discussed:  Bleeding, incomplete drainage, pain and infection   Alternatives discussed:  Observation and alternative treatment Location:    Type:  Abscess   Size:  4x4cm   Location:  Lower extremity   Lower extremity location:  Buttock   Buttock location:  R buttock Pre-procedure details:    Skin preparation:  Chlorhexidine  Sedation:    Sedation type:  None Anesthesia:    Anesthesia method:  Local infiltration   Local anesthetic:  Lidocaine  1% WITH epi Procedure type:    Complexity:  Complex Procedure details:    Needle aspiration: no     Incision types:  Single straight   Incision depth:  Subcutaneous   Scalpel blade:  11   Wound management:  Probed and deloculated and irrigated with saline   Drainage:  Bloody and purulent   Drainage amount:  Moderate   Wound treatment:  Wound left open   Packing materials:  None Post-procedure details:    Procedure completion:  Tolerated well, no immediate complications Comments:     Dressed with gauze and bandage.

## 2023-11-24 NOTE — Assessment & Plan Note (Signed)
 Chronic, controlled. Follows with dermatology. Was previously taking Skyrizi  but has not been on it due to recurrent abscesses. Recommend follow up with dermatology.

## 2023-11-24 NOTE — Assessment & Plan Note (Signed)
 Chronic, uncontrolled. Currently taking 200mg  trazodone nightly with some improvement, however still has trouble falling asleep. Tried Atarax previously but made patient too groggy. - Discussed sleep hygiene techniques - Continue trazodone - Recommend taking melatonin with trazodone at night - Will follow up at next visit in 2 weeks

## 2023-11-27 NOTE — Addendum Note (Signed)
 Addended by: FRANCHOT RAKE A on: 11/27/2023 12:18 PM   Modules accepted: Level of Service

## 2023-12-01 ENCOUNTER — Ambulatory Visit
Admission: RE | Admit: 2023-12-01 | Discharge: 2023-12-01 | Disposition: A | Discharge status: Home / Self Care | Attending: Urology | Admitting: Urology

## 2023-12-01 ENCOUNTER — Other Ambulatory Visit
Admission: RE | Admit: 2023-12-01 | Discharge: 2023-12-01 | Disposition: A | Discharge status: Home / Self Care | Source: Home / Self Care | Attending: Urology | Admitting: Urology

## 2023-12-01 ENCOUNTER — Ambulatory Visit
Admission: RE | Admit: 2023-12-01 | Discharge: 2023-12-01 | Disposition: A | Discharge status: Home / Self Care | Source: Ambulatory Visit | Attending: Urology | Admitting: Urology

## 2023-12-01 ENCOUNTER — Ambulatory Visit (INDEPENDENT_AMBULATORY_CARE_PROVIDER_SITE_OTHER): Admitting: Urology

## 2023-12-01 VITALS — BP 119/78 | HR 64 | Wt 225.0 lb

## 2023-12-01 DIAGNOSIS — N50819 Testicular pain, unspecified: Secondary | ICD-10-CM

## 2023-12-01 DIAGNOSIS — N529 Male erectile dysfunction, unspecified: Secondary | ICD-10-CM | POA: Diagnosis not present

## 2023-12-01 DIAGNOSIS — N200 Calculus of kidney: Secondary | ICD-10-CM

## 2023-12-01 MED ORDER — TADALAFIL 20 MG PO TABS: 20.0000 mg | ORAL_TABLET | Freq: Every day | ORAL | 6 refills | Status: AC | PRN

## 2023-12-01 NOTE — Patient Instructions (Signed)
 Understanding Erectile Dysfunction (ED)  What is ED? Erectile Dysfunction, or ED, is when a man has trouble getting or keeping an erection enough for sex. It is common and can happen at any age but is more common as men get older.  What Causes ED? ED can happen for many reasons, including:    Stress or feeling anxious Health problems like diabetes, heart disease, or high blood pressure Lifestyle habits like smoking, drinking too much alcohol, drug use, or not enough exercise Certain medicines(some blood pressure medications, antidepressants, sedatives)  Symptoms of ED:   Difficulty getting an erection Trouble keeping an erection during sex Reduced interest in sex  Treatment Options There are different ways to treat ED. Talk to your doctor to find what's best for you.  Lifestyle Changes   Exercise regularly Eat healthy foods Quit smoking and limit alcohol Weight loss Reduce stress  Medications   Oral medicines like Viagra(sildenafil) or Cialis (tadalafil ).  These are generic and affordable, the lowest price is at [email or web address].com.  These must be taken about an hour before sexual activity, and still require sexual stimulation to get an erection Side effects can include stuffy nose, headache, muscle pain, or changes in vision There is no risk of heart attack or stroke when medications are taken as directed These medications cannot be taken with nitrates for chest pain  Other Treatments    Penile injections-a medicine is injected directly into the penis to give you an immediate erection Devices like pump vacuum systems that help create an erection Surgery: Penile prosthesis for patients with no improvement with medicines or injections who are still interested in sexual activity.  Visit [email or web address] for more details.  Dr. Lovie is a urologist in [facility] with [facility] who performs penile prosthesis surgeries Counseling or Therapy    If ED is caused by  stress, anxiety, or relationship issues, talking to a counselor or therapist can help.

## 2023-12-01 NOTE — Progress Notes (Signed)
   12/01/2023 10:20 AM   Derald Annis 09-17-1970 [id number]  Reason for visit: Follow up scrotal pain, left renal stone, discuss ED, buried penis  History: Comorbid with obesity BMI of 35, works as a Naval architect Previously seen in 2023 for scrotal pain, ultimately responded to gabapentin  History of nonobstructive left lower pole renal stone and opted for surveillance  Physical Exam: BP 119/78 (BP Location: Left Arm, Patient Position: Sitting, Cuff Size: Large)   Pulse 64   Wt 225 lb (102.1 kg)   SpO2 99%   BMI 35.24 kg/m   Imaging/labs: I personally viewed and interpreted the CT February 2023 showing a 1.5 cm left lower pole nonobstructive stone, and the scrotal ultrasound April 2023 with no acute findings, and stable 3 mm left testicular cyst stable from prior ultrasound and likely benign  Today: Primary complaint today's ED.  He is here with his wife today who provides most of the history.  Has had trouble with erections for over 15 years, no improvement previously on sildenafil.  He is also having some problems with penile retraction and urinary dribbling secondary to suprapubic fat pad Denies any flank pain, scrotal pain has also resolved  Plan:   ED: AUA guidelines reviewed, with his comorbidities need to have some realistic expectations.  Can trial Cialis 20 mg on demand and will check morning testosterone.  Weight loss and exercise encouraged, obesity with large suprapubic fat pad likely contributing  Left renal stone: Recommend KUB today for surveillance, call with results Scrotal pain: Resolved RTC 1 month symptom check and discuss ED   Redell JAYSON Burnet, MD  [facility] [address], [address] [place], [place] [address] [phone]

## 2023-12-02 LAB — TESTOSTERONE: Testosterone: 271 ng/dL (ref 264–916)

## 2023-12-04 ENCOUNTER — Ambulatory Visit: Payer: Self-pay | Admitting: Urology

## 2023-12-05 ENCOUNTER — Encounter: Payer: Self-pay | Admitting: Dermatology

## 2023-12-22 ENCOUNTER — Ambulatory Visit

## 2023-12-22 NOTE — Progress Notes (Deleted)
      Established patient visit   Patient: Alexander Kennedy   DOB: March 10, 1970   53 y.o. Male  MRN: [id number] Visit Date: 12/22/2023  Today's healthcare provider: Isaiah DELENA Pepper, MD   No chief complaint on file.  Subjective    HPI  Discussed the use of AI scribe software for clinical note transcription with the patient, who gave verbal consent to proceed.  History of Present Illness   - lung cancer screen   Medications: Outpatient Medications Prior to Visit  Medication Sig   albuterol  (VENTOLIN  HFA) 108 (90 Base) MCG/ACT inhaler Inhale 1-2 puffs into the lungs every 6 (six) hours as needed for wheezing or shortness of breath.   ibuprofen  (ADVIL ) 200 MG tablet Take 600 mg by mouth every 8 (eight) hours as needed for moderate pain.   mupirocin  ointment (BACTROBAN ) 2 % Apply 1 Application topically 2 (two) times daily. Apply to nostrils, around ears, armpits, belly button twice per day for 7 days. For nostrils: put ointment on one end of a cotton-tip swab (Q-tips). Apply the medicine all around the inside of one nostril. Then, put ointment on the other unused end of the cotton-tip swab and apply inside the other nostril. Press nostrils together and massage gently for 60 seconds.   Risankizumab -rzaa (SKYRIZI  PEN) 150 MG/ML SOAJ Inject 150 mg into the skin as directed. Every 12 weeks for maintenance.   tadalafil (CIALIS) 20 MG tablet Take 1 tablet (20 mg total) by mouth daily as needed for erectile dysfunction (take 1 hour prior to sexual activity).   Tiotropium Bromide (SPIRIVA RESPIMAT) 2.5 MCG/ACT AERS Inhale 2 puffs into the lungs.   traZODone (DESYREL) 100 MG tablet Take 2 tablets (200 mg total) by mouth at bedtime.   No facility-administered medications prior to visit.    Review of Systems as noted in HPI.  {Insert previous labs (optional):23779} {See past labs  Heme  Chem  Endocrine  Serology  Results Review (optional):1}   Objective    There were no vitals taken for  this visit. {Insert last BP/Wt (optional):23777}{See vitals history (optional):1}  Physical Exam   No results found for any visits on 12/22/23.  Assessment & Plan     Problem List Items Addressed This Visit   None   Assessment and Plan Assessment & Plan       No follow-ups on file.       Isaiah DELENA Pepper, MD  [facility] [phone] (phone) [phone] (fax)

## 2023-12-28 NOTE — Progress Notes (Deleted)
 01/01/2024 11:49 AM   Alexander Kennedy 12/31/1970 [id number]  Referring provider: Franchot Isaiah LABOR, MD [address] [place],  [place] [address]  Urological history: 1. ED - tadalafil 20 mg, on-demand-dosing   2.  Nephrolithiasis - CT renal stone study (03/2021) 1.5 cm left lower pole stone and tiny nonobstructing calculus in the inferior pole of the right kidney - KUB (11/2023) 1.7 cm left renal stone  3. BPH with LU TS - prostate volume (03/2021) 49 cc   4.  Small lateral hydroceles - Scrotal ultrasound (2023)   5.  Testicular cyst - scrotal ultrasound (2023) Unchanged 3 mm hypoechoic focus in the left testicle    No chief complaint on file.  HPI: Alexander Kennedy is a 53 y.o. man who presents today for 1 month follow-up after trial of tadalafil 20 mg on-demand dosing  Previous records reviewed.  SHIM ***  He does not have confidence that he could get and keep an erection, his erections are not firm enough for penetrative intercourse, he has difficulty maintaining his erections,  and he is not finding intercourse satisfactory for him.  ***  Patient still having spontaneous erections.  ***   He denies any pain or curvature with erections.    He is not able to ejaculate, has pain with ejaculation, and has blood in his ejaculate fluid.   ***  Testosterone level (11/2023) 271   Cholesterol ***  Hemoglobin A1c ***  TSH ***   Tried and failed ***  PMH: Past Medical History:  Diagnosis Date   Anxiety    COPD (chronic obstructive pulmonary disease) (HCC)    COVID-19 2020   GERD (gastroesophageal reflux disease)    RARE   History of kidney stones    HTN (hypertension) 10/13/2020   DIET CONTROLLED- NO MEDICATION   Hypertension    Inguinal hernia, bilateral    Pneumonia    PONV (postoperative nausea and vomiting) 06/19/2017   Psoriasis    Sleep apnea    DOES NOT USE CPAP   Tremors of nervous system     Surgical History: Past Surgical  History:  Procedure Laterality Date   BURR HOLE W/ STEREOTACTIC INSERTION OF DBS LEADS / INTRAOP MICROELECTRODE RECORDING     COLONOSCOPY WITH PROPOFOL  N/A 08/27/2020   Procedure: COLONOSCOPY WITH PROPOFOL ;  Surgeon: Janalyn Keene NOVAK, MD;  Location: [facility];  Service: Endoscopy;  Laterality: N/A;   HAND ARTHROSCOPY Left    KNEE SURGERY Left    SHOULDER ARTHROSCOPY WITH OPEN ROTATOR CUFF REPAIR AND DISTAL CLAVICLE ACROMINECTOMY Right 10/17/2019   Procedure: SHOULDER ARTHROSCOPY WITH OPEN ROTATOR CUFF REPAIR AND biceps tenodysis;  Surgeon: Marchia Drivers, MD;  Location: [facility];  Service: Orthopedics;  Laterality: Right;   SHOULDER ARTHROSCOPY WITH OPEN ROTATOR CUFF REPAIR AND DISTAL CLAVICLE ACROMINECTOMY Right 10/20/2020   Procedure: RIGHT SHOULDER ARTHROSCOPY WITH MINI-OPEN ROTATOR CUFF REPAIR AND DISTAL CLAVICLE ACROMINECTOMY;  Surgeon: Marchia Drivers, MD;  Location: [facility];  Service: Orthopedics;  Laterality: Right;   TRIGGER FINFER RELEASE     ON 2 FINGERS, AND EXPLORATORY SCOPING    Home Medications:  Allergies as of 01/01/2024       Reactions   Penicillins Itching, Shortness Of Breath, Other (See Comments)   Other: Dizziness Tolerated 1st generation cephalosporin (CEFAZOLIN ) on 10/17/2019 without ADRs. PCN reaction causing immediate rash, facial/tongue/throat swelling, SOB or lightheadedness with hypotension: Yes PCN reaction causing severe rash involving mucus membranes or skin necrosis: No PCN reaction that required hospitalization: No  PCN reaction occurring within the last 10 years: No If all of the above answers are NO, then may proceed with Cephalosporin use. Other reaction(s): Dizziness, Other (See Comments), Unknown Dizziness also Has patient had a PCN reaction causing immediate rash, facial/tongue/throat swelling, SOB or lightheadedness with hypotension: Yes Has patient had a PCN reaction causing severe rash involving mucus membranes or skin necrosis:  No Has patient had a PCN reaction that required hospitalization: No Has patient had a PCN reaction occurring within the last 10 years: No Has patient had a PCN reaction causing immediate rash, facial/tongue/throat swelling, SOB or lighth... (TRUNCATED) Dizziness also, Has patient had a PCN reaction causing immediate rash, facial/tongue/throat swelling, SOB or lightheadedness with hypotension: Yes, Has patient had a PCN reaction causing severe rash involving mucus membranes or skin necrosis: No, Has patient had a PCN reaction that required hospitalization: No, Has patient had a PCN reaction occurring within the last 10 years: No, If all of the above answers are NO, then may proceed with Cephalosporin use.   Antihistamines, Chlorpheniramine-type    Other reaction(s): Other (See Comments) sneeze        Medication List        Accurate as of December 28, 2023 11:49 AM. If you have any questions, ask your nurse or doctor.          albuterol  108 (90 Base) MCG/ACT inhaler Commonly known as: VENTOLIN  HFA Inhale 1-2 puffs into the lungs every 6 (six) hours as needed for wheezing or shortness of breath.   ibuprofen  200 MG tablet Commonly known as: ADVIL  Take 600 mg by mouth every 8 (eight) hours as needed for moderate pain.   mupirocin  ointment 2 % Commonly known as: BACTROBAN  Apply 1 Application topically 2 (two) times daily. Apply to nostrils, around ears, armpits, belly button twice per day for 7 days. For nostrils: put ointment on one end of a cotton-tip swab (Q-tips). Apply the medicine all around the inside of one nostril. Then, put ointment on the other unused end of the cotton-tip swab and apply inside the other nostril. Press nostrils together and massage gently for 60 seconds.   Skyrizi  Pen 150 MG/ML pen Generic drug: risankizumab -rzaa Inject 150 mg into the skin as directed. Every 12 weeks for maintenance.   Spiriva Respimat 2.5 MCG/ACT Aers Generic drug: Tiotropium  Bromide Inhale 2 puffs into the lungs.   tadalafil 20 MG tablet Commonly known as: CIALIS Take 1 tablet (20 mg total) by mouth daily as needed for erectile dysfunction (take 1 hour prior to sexual activity).   traZODone 100 MG tablet Commonly known as: DESYREL Take 2 tablets (200 mg total) by mouth at bedtime.        Allergies:  Allergies  Allergen Reactions   Penicillins Itching, Shortness Of Breath and Other (See Comments)    Other: Dizziness  Tolerated 1st generation cephalosporin (CEFAZOLIN ) on 10/17/2019 without ADRs.  PCN reaction causing immediate rash, facial/tongue/throat swelling, SOB or lightheadedness with hypotension: Yes  PCN reaction causing severe rash involving mucus membranes or skin necrosis: No  PCN reaction that required hospitalization: No  PCN reaction occurring within the last 10 years: No  If all of the above answers are NO, then may proceed with Cephalosporin use.  Other reaction(s): Dizziness, Other (See Comments), Unknown  Dizziness also  Has patient had a PCN reaction causing immediate rash, facial/tongue/throat swelling, SOB or lightheadedness with hypotension: Yes  Has patient had a PCN reaction causing severe rash involving mucus membranes or skin  necrosis: No  Has patient had a PCN reaction that required hospitalization: No  Has patient had a PCN reaction occurring within the last 10 years: No  Has patient had a PCN reaction causing immediate rash, facial/tongue/throat swelling, SOB or lighth... (TRUNCATED)  Dizziness also, Has patient had a PCN reaction causing immediate rash, facial/tongue/throat swelling, SOB or lightheadedness with hypotension: Yes, Has patient had a PCN reaction causing severe rash involving mucus membranes or skin necrosis: No, Has patient had a PCN reaction that required hospitalization: No, Has patient had a PCN reaction occurring within the last 10 years: No, If all of the above answers are NO, then may  proceed with Cephalosporin use.   Antihistamines, Chlorpheniramine-Type     Other reaction(s): Other (See Comments) sneeze    Family History: Family History  Problem Relation Age of Onset   Congestive Heart Failure Father    Colon cancer Maternal Uncle     Social History:  reports that he quit smoking about 4 years ago. His smoking use included cigarettes. He started smoking about 34 years ago. He has a 30 pack-year smoking history. He has been exposed to tobacco smoke. He has never used smokeless tobacco. He reports that he does not drink alcohol and does not use drugs.  ROS: Pertinent ROS in HPI  Physical Exam: There were no vitals taken for this visit.  Constitutional:  Well nourished. Alert and oriented, No acute distress. HEENT: [place] AT, moist mucus membranes.  Trachea midline, no masses. Cardiovascular: No clubbing, cyanosis, or edema. Respiratory: Normal respiratory effort, no increased work of breathing. GI: Abdomen is soft, non tender, non distended, no abdominal masses. Liver and spleen not palpable.  No hernias appreciated.  Stool sample for occult testing is not indicated.   GU: No CVA tenderness.  No bladder fullness or masses.  Patient with circumcised/uncircumcised phallus. ***Foreskin easily retracted***  Urethral meatus is patent.  No penile discharge. No penile lesions or rashes. Scrotum without lesions, cysts, rashes and/or edema.  Testicles are located scrotally bilaterally. No masses are appreciated in the testicles. Left and right epididymis are normal. Rectal: Patient with  normal sphincter tone. Anus and perineum without scarring or rashes. No rectal masses are appreciated. Prostate is approximately *** grams, *** nodules are appreciated. Seminal vesicles are normal. Skin: No rashes, bruises or suspicious lesions. Lymph: No cervical or inguinal adenopathy. Neurologic: Grossly intact, no focal deficits, moving all 4 extremities. Psychiatric: Normal mood and  affect.  Laboratory Data: See Epic and HPI   I have reviewed the labs.   Pertinent Imaging: N/A  Assessment & Plan:  ***  1. Erectile dysfunction  I explained that conditions like diabetes, hypertension, coronary artery disease, peripheral vascular disease, smoking, alcohol consumption, age, sleep apnea and BPH can diminish the ability to have an erection ***  I explained the ED may be a risk marker for underlying CVD and he should follow up with PCP for further studies ***  We will obtain a serum testosterone level at this time; if it is abnormal we will need to repeat the study for confirmation ***  Explained that moderate to vigorous aerobic exercise for 40 minutes 4 times per week can decrease erectile problems caused by physical inactivity, obesity, hypertension, metabolic syndrome and/or cardiovascular diseases ***  We discussed trying a *** different PDE5 inhibitor, intra-urethral suppositories, ICI, vacuum erection devices, Li-SWT, and penile prosthesis implantation   No follow-ups on file.  These notes generated with voice recognition software. I apologize for typographical  errors.  CLOTILDA HELON RIGGERS  [facility] [address]  [address] [place], [place] [address] [phone]

## 2024-01-01 ENCOUNTER — Ambulatory Visit: Admitting: Urology

## 2024-01-01 ENCOUNTER — Ambulatory Visit: Payer: Self-pay | Admitting: Dermatology

## 2024-01-01 ENCOUNTER — Encounter: Payer: Self-pay | Admitting: Urology

## 2024-01-01 DIAGNOSIS — N529 Male erectile dysfunction, unspecified: Secondary | ICD-10-CM

## 2024-01-01 LAB — QUANTIFERON-TB GOLD PLUS
QuantiFERON Mitogen Value: >10 [IU]/mL
QuantiFERON Nil Value: 0.01 [IU]/mL
QuantiFERON TB1 Ag Value: 0.01 [IU]/mL
QuantiFERON TB2 Ag Value: 0.01 [IU]/mL
QuantiFERON-TB Gold Plus: NEGATIVE

## 2024-01-21 ENCOUNTER — Ambulatory Visit
Admission: EM | Admit: 2024-01-21 | Discharge: 2024-01-21 | Disposition: A | Discharge status: Home / Self Care | Attending: Student | Admitting: Student

## 2024-01-21 DIAGNOSIS — L02512 Cutaneous abscess of left hand: Secondary | ICD-10-CM | POA: Diagnosis not present

## 2024-01-21 MED ORDER — DOXYCYCLINE HYCLATE 100 MG PO CAPS: 100.0000 mg | ORAL_CAPSULE | Freq: Two times a day (BID) | ORAL | 0 refills | Status: AC

## 2024-01-21 MED ORDER — IBUPROFEN 800 MG PO TABS: 800.0000 mg | ORAL_TABLET | Freq: Three times a day (TID) | ORAL | 0 refills | Status: AC

## 2024-01-21 NOTE — ED Triage Notes (Signed)
 Pt presents with left hand swelling and pain. Patient states he woke up this morning with his hand swollen.

## 2024-01-21 NOTE — Discharge Instructions (Addendum)
-  Doxycycline  twice daily for 7 days.  Make sure to wear sunscreen while spending time outside while on this medication as it can increase your chance of sunburn. You can take this medication with food if you have a sensitive stomach. -Ibuprofen  800mg  up to every 8 hours.  -Wash your abscess with gentle soap and water 2+ times daily. Do a warm compress or soak in bathtub 2+ daily. Let air dry or gently pat. Avoid cleansing with hydrogen peroxide or alcohol!! -Seek additional medical attention (head to the ER) if the wound is getting worse instead of better, especially after 48 hours- redness increasing in size, pain getting worse, new fevers/chills, etc.

## 2024-01-21 NOTE — ED Provider Notes (Signed)
 Alexander Kennedy    CSN: [id number] Arrival date & time: 01/21/24  1338      History   Chief Complaint Chief Complaint  Patient presents with   Hand Pain    HPI Alexander Kennedy is a 53 y.o. male presenting with L hand swelling and pain, since this morning.  Denies fevers, drainage. History recurrent abscesses. Has had trigger finger surgery on the L hand >3 years ago.   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   Cellulitis and abscess of buttock 11/24/2023   Special screening for malignant neoplasms, colon    Polyp of sigmoid colon    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   PONV (postoperative nausea and vomiting) 06/19/2017   Back pain 01/20/2017   Psoriasis 01/20/2017   Cigarette nicotine dependence without complication 10/07/2015   Erectile dysfunction 10/07/2015   Essential tremor 10/07/2015    Past Surgical History:  Procedure Laterality Date   BURR HOLE W/ STEREOTACTIC INSERTION OF DBS LEADS / INTRAOP MICROELECTRODE RECORDING     COLONOSCOPY WITH PROPOFOL  N/A 08/27/2020   Procedure: COLONOSCOPY WITH PROPOFOL ;  Surgeon: Janalyn Keene NOVAK, MD;  Location: [facility];  Service: Endoscopy;  Laterality: N/A;   HAND ARTHROSCOPY Left    KNEE SURGERY Left    SHOULDER ARTHROSCOPY WITH OPEN ROTATOR CUFF REPAIR AND DISTAL CLAVICLE ACROMINECTOMY Right 10/17/2019   Procedure: SHOULDER  ARTHROSCOPY WITH OPEN ROTATOR CUFF REPAIR AND biceps tenodysis;  Surgeon: Marchia Drivers, MD;  Location: [facility];  Service: Orthopedics;  Laterality: Right;   SHOULDER ARTHROSCOPY WITH OPEN ROTATOR CUFF REPAIR AND DISTAL CLAVICLE ACROMINECTOMY Right 10/20/2020   Procedure: RIGHT SHOULDER ARTHROSCOPY WITH MINI-OPEN ROTATOR CUFF REPAIR AND DISTAL CLAVICLE ACROMINECTOMY;  Surgeon: Marchia Drivers, MD;  Location: [facility];  Service: Orthopedics;  Laterality: Right;   TRIGGER FINFER RELEASE     ON 2 FINGERS, AND EXPLORATORY SCOPING       Home Medications    Prior to Admission medications   Medication Sig Start Date End Date Taking? Authorizing Provider  albuterol  (VENTOLIN  HFA) 108 (90 Base) MCG/ACT inhaler Inhale 1-2 puffs into the lungs every 6 (six) hours as needed for wheezing or shortness of breath. 11/24/23  Yes Franchot Isaiah LABOR, MD  doxycycline  (VIBRAMYCIN ) 100 MG capsule Take 1 capsule (100 mg total) by mouth 2 (two) times daily for 7 days. 01/21/24 01/28/24 Yes Stryder Poitra E, PA-C  ibuprofen  (ADVIL ) 800 MG tablet Take 1 tablet (800 mg total) by mouth 3 (three) times daily. 01/21/24  Yes Berdella Bacot E, PA-C  mupirocin  ointment (BACTROBAN ) 2 % Apply 1 Application topically 2 (two) times daily. Apply to nostrils, around ears, armpits, belly button twice per day for 7 days. For nostrils: put ointment on one end of a cotton-tip swab (Q-tips). Apply the medicine all around the inside of one nostril.  Then, put ointment on the other unused end of the cotton-tip swab and apply inside the other nostril. Press nostrils together and massage gently for 60 seconds. 11/13/23  Yes Claudene Lehmann, MD  Risankizumab -rzaa (SKYRIZI  PEN) 150 MG/ML SOAJ Inject 150 mg into the skin as directed. Every 12 weeks for maintenance. 05/16/22  Yes Hester Alm BROCKS, MD  tadalafil  (CIALIS ) 20 MG tablet Take 1 tablet (20 mg total) by mouth daily as needed for erectile dysfunction (take 1 hour prior to sexual  activity). 12/01/23  Yes Francisca Redell BROCKS, MD  Tiotropium Bromide (SPIRIVA RESPIMAT) 2.5 MCG/ACT AERS Inhale 2 puffs into the lungs. 03/22/17  Yes [provider]  traZODone  (DESYREL ) 100 MG tablet Take 2 tablets (200 mg total) by mouth at bedtime. 11/24/23  Yes Franchot Isaiah LABOR, MD    Family History Family History  Problem Relation Age of Onset   Congestive Heart Failure Father    Colon cancer Maternal Uncle     Social History Social History   Tobacco Use   Smoking status: Former    Current packs/day: 0.00    Average packs/day: 1 pack/day for 30.0 years (30.0 ttl pk-yrs)    Types: Cigarettes    Start date: 10/02/1989    Quit date: 10/03/2019    Years since quitting: 4.3    Passive exposure: Past   Smokeless tobacco: Never  Vaping Use   Vaping status: Every Day   Start date: 10/11/2020   Substances: Nicotine  Substance Use Topics   Alcohol use: No   Drug use: No     Allergies   Penicillins and Antihistamines, chlorpheniramine-type   Review of Systems Review of Systems  Skin:        Abscess     Physical Exam Triage Vital Signs ED Triage Vitals  Encounter Vitals Group     BP 01/21/24 1430 (!) 154/98     Girls Systolic BP Percentile --      Girls Diastolic BP Percentile --      Boys Systolic BP Percentile --      Boys Diastolic BP Percentile --      Pulse Rate 01/21/24 1430 81     Resp 01/21/24 1430 18     Temp 01/21/24 1430 98.1 F (36.7 C)     Temp Source 01/21/24 1430 Oral     SpO2 01/21/24 1430 98 %     Weight --      Height --      Head Circumference --      Peak Flow --      Pain Score 01/21/24 1431 6     Pain Loc --      Pain Education --      Exclude from Growth Chart --    No data found.  Updated Vital Signs BP (!) 154/98 (BP Location: Left Arm)   Pulse 81   Temp 98.1 F (36.7 C) (Oral)   Resp 18   SpO2 98%   Visual Acuity Right Eye Distance:   Left Eye Distance:   Bilateral Distance:    Right Eye Near:   Left Eye Near:     Bilateral Near:     Physical Exam Vitals reviewed.  Constitutional:      General: He is not in acute distress.    Appearance: Normal appearance. He is not ill-appearing.  HENT:     Head: Normocephalic and atraumatic.  Pulmonary:     Effort: Pulmonary effort is normal.  Skin:    Comments:  See image below Dorsum of L hand with two abscesses. Abscess on wrist is 1.5x1.5cm, with overlying scabbing. Minimal warmth. Abscess on hand is 1.5x1.5cm, with warmth and induration. NO drainage.  Neurological:     General: No focal deficit present.     Mental Status: He is alert and oriented to person, place, and time.  Psychiatric:        Mood and Affect: Mood normal.        Behavior: Behavior normal.        Thought Content: Thought content normal.        Judgment: Judgment normal.       UC Treatments / Results  Labs (all labs ordered are listed, but only abnormal results are displayed) Labs Reviewed - No data to display  EKG   Radiology No results found.  Procedures Procedures (including critical care time)  Medications Ordered in UC Medications - No data to display  Initial Impression / Assessment and Plan / UC Course  I have reviewed the triage vital signs and the nursing notes.  Pertinent labs & imaging results that were available during my care of the patient were reviewed by me and considered in my medical decision making (see chart for details).     Patient is a pleasant 53 year old male presenting with 2 abscesses on the dorsum of the left hand. The patient is afebrile and nontachycardic.  Antipyretic has not been administered today.  Doxycycline  sent.  Given the location of the abscesses, if his symptoms worsen instead of improve, particularly after 24-48 hours on the antibiotic, he should present to the emergency department.  Final Clinical Impressions(s) / UC Diagnoses   Final diagnoses:  Abscess of dorsum of left hand     Discharge Instructions       -Doxycycline  twice daily for 7 days.  Make sure to wear sunscreen while spending time outside while on this medication as it can increase your chance of sunburn. You can take this medication with food if you have a sensitive stomach. -Ibuprofen  800mg  up to every 8 hours.  -Wash your abscess with gentle soap and water 2+ times daily. Do a warm compress or soak in bathtub 2+ daily. Let air dry or gently pat. Avoid cleansing with hydrogen peroxide or alcohol!! -Seek additional medical attention (head to the ER) if the wound is getting worse instead of better, especially after 48 hours- redness increasing in size, pain getting worse, new fevers/chills, etc.      ED Prescriptions     Medication Sig Dispense Auth. Provider   doxycycline  (VIBRAMYCIN ) 100 MG capsule Take 1 capsule (100 mg total) by mouth 2 (two) times daily for 7 days. 14 capsule Rip Hawes E, PA-C   ibuprofen  (ADVIL ) 800 MG tablet Take 1 tablet (800 mg total) by mouth 3 (three) times daily. 21 tablet Garret Teale E, PA-C      PDMP not reviewed this encounter.   Arlyss Leita BRAVO, PA-C 01/21/24 [phone]

## 2024-02-19 ENCOUNTER — Ambulatory Visit
Admission: EM | Admit: 2024-02-19 | Discharge: 2024-02-19 | Disposition: A | Discharge status: Home / Self Care | Attending: Emergency Medicine | Admitting: Emergency Medicine

## 2024-02-19 ENCOUNTER — Encounter: Payer: Self-pay | Admitting: Emergency Medicine

## 2024-02-19 DIAGNOSIS — J069 Acute upper respiratory infection, unspecified: Secondary | ICD-10-CM

## 2024-02-19 DIAGNOSIS — J441 Chronic obstructive pulmonary disease with (acute) exacerbation: Secondary | ICD-10-CM

## 2024-02-19 LAB — POCT INFLUENZA A/B
Influenza A, POC: NEGATIVE
Influenza B, POC: NEGATIVE

## 2024-02-19 LAB — POC SOFIA SARS ANTIGEN FIA: SARS Coronavirus 2 Ag: NEGATIVE

## 2024-02-19 MED ORDER — GUAIFENESIN-CODEINE 100-10 MG/5ML PO SOLN: 5.0000 mL | Freq: Four times a day (QID) | ORAL | 0 refills | Status: AC | PRN

## 2024-02-19 MED ORDER — AZITHROMYCIN 250 MG PO TABS: 250.0000 mg | ORAL_TABLET | Freq: Every day | ORAL | 0 refills | Status: AC

## 2024-02-19 MED ORDER — PREDNISONE 10 MG (21) PO TBPK: ORAL_TABLET | Freq: Every day | ORAL | 0 refills | Status: AC

## 2024-02-19 MED ORDER — BENZONATATE 100 MG PO CAPS: 100.0000 mg | ORAL_CAPSULE | Freq: Three times a day (TID) | ORAL | 0 refills | Status: AC

## 2024-02-19 NOTE — Discharge Instructions (Addendum)
 Your symptoms today are most likely being caused by a virus and should steadily improve in time it can take up to 7 to 10 days before you truly start to see a turnaround however things will get better, at this time I do believe the virus is flaring up your asthma  COVID and flu testing negative  You have been placed on antibiotic daily so symptoms do not worsen and progress to lung condition such as pneumonia, take azithromycin  as directed  To open and relax your airway begin prednisone  every morning with food as directed  Continue use of inhalers as needed  You may use Tessalon  pill every 8 hours as needed for coughing and may use cough syrup every 6 hours would be mindful can make you drowsy    You can take Tylenol  and/or Ibuprofen  as needed for fever reduction and pain relief.   For cough: honey 1/2 to 1 teaspoon (you can dilute the honey in water or another fluid).  You can also use guaifenesin and dextromethorphan for cough. You can use a humidifier for chest congestion and cough.  If you don't have a humidifier, you can sit in the bathroom with the hot shower running.      For sore throat: try warm salt water gargles, cepacol lozenges, throat spray, warm tea or water with lemon/honey, popsicles or ice, or OTC cold relief medicine for throat discomfort.   For congestion: take a daily anti-histamine like Zyrtec, Claritin, and a oral decongestant, such as pseudoephedrine.  You can also use Flonase 1-2 sprays in each nostril daily.   It is important to stay hydrated: drink plenty of fluids (water, gatorade/powerade/pedialyte, juices, or teas) to keep your throat moisturized and help further relieve irritation/discomfort.

## 2024-02-19 NOTE — ED Provider Notes (Signed)
 " Alexander Kennedy    CSN: [id number] Arrival date & time: 02/19/24  1055      History   Chief Complaint Chief Complaint  Patient presents with   Cough   Sore Throat   Chills   Generalized Body Aches    HPI Alexander Kennedy is a 53 y.o. male.   Patient presents for evaluation of nasal congestion, rhinorrhea, sinus pain and pressure t, sore throat, nonproductive cough, shortness of breath with exertion, wheezing present for 2 days.  Associated diarrhea beginning 1 day ago.  Known sick contact at [facility], unknown illness.  History of COPDRash, former smoker, currently vaping.  Tolerable to some food and liquids. 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   Cellulitis and abscess of buttock 11/24/2023   Special screening for malignant neoplasms, colon    Polyp of sigmoid colon    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   PONV (postoperative nausea and vomiting) 06/19/2017   Back pain 01/20/2017   Psoriasis 01/20/2017   Cigarette nicotine dependence without complication 10/07/2015   Erectile dysfunction 10/07/2015   Essential tremor 10/07/2015    Past Surgical History:  Procedure Laterality Date   BURR HOLE W/ STEREOTACTIC INSERTION OF DBS LEADS / INTRAOP MICROELECTRODE RECORDING     COLONOSCOPY WITH PROPOFOL  N/A 08/27/2020   Procedure: COLONOSCOPY WITH PROPOFOL ;  Surgeon: Janalyn Keene NOVAK, MD;  Location:  [facility];  Service: Endoscopy;  Laterality: N/A;   HAND ARTHROSCOPY Left    KNEE SURGERY Left    SHOULDER ARTHROSCOPY WITH OPEN ROTATOR CUFF REPAIR AND DISTAL CLAVICLE ACROMINECTOMY Right 10/17/2019   Procedure: SHOULDER ARTHROSCOPY WITH OPEN ROTATOR CUFF REPAIR AND biceps tenodysis;  Surgeon: Marchia Drivers, MD;  Location: [facility];  Service: Orthopedics;  Laterality: Right;   SHOULDER ARTHROSCOPY WITH OPEN ROTATOR CUFF REPAIR AND DISTAL CLAVICLE ACROMINECTOMY Right 10/20/2020   Procedure: RIGHT SHOULDER ARTHROSCOPY WITH MINI-OPEN ROTATOR CUFF REPAIR AND DISTAL CLAVICLE ACROMINECTOMY;  Surgeon: Marchia Drivers, MD;  Location: [facility];  Service: Orthopedics;  Laterality: Right;   TRIGGER FINFER RELEASE     ON 2 FINGERS, AND EXPLORATORY SCOPING       Home Medications    Prior to Admission medications  Medication Sig Start Date End Date Taking? Authorizing Provider  albuterol  (VENTOLIN  HFA) 108 (90 Base) MCG/ACT inhaler Inhale 1-2 puffs into the lungs every 6 (six) hours as needed for wheezing or shortness of breath. 11/24/23   Franchot Isaiah LABOR, MD  ibuprofen  (ADVIL ) 800 MG tablet Take 1 tablet (800 mg total) by mouth 3 (three) times daily. 01/21/24   Graham, Laura E, PA-C  mupirocin  ointment (BACTROBAN ) 2 % Apply 1 Application topically 2 (two) times daily. Apply to nostrils, around ears, armpits, belly button twice per day for 7 days. For nostrils: put ointment on one end of a cotton-tip swab (Q-tips). Apply  the medicine all around the inside of one nostril. Then, put ointment on the other unused end of the cotton-tip swab and apply inside the other nostril. Press nostrils together and massage gently for 60 seconds. 11/13/23   Claudene Lehmann, MD  Risankizumab -rzaa (SKYRIZI  PEN) 150 MG/ML SOAJ Inject 150 mg into the skin as directed. Every 12 weeks for maintenance. 05/16/22   Hester Alm BROCKS, MD  tadalafil  (CIALIS ) 20 MG tablet Take 1 tablet (20 mg total) by mouth daily as needed  for erectile dysfunction (take 1 hour prior to sexual activity). 12/01/23   Francisca Redell BROCKS, MD  Tiotropium Bromide (SPIRIVA RESPIMAT) 2.5 MCG/ACT AERS Inhale 2 puffs into the lungs. 03/22/17   [provider]  traZODone  (DESYREL ) 100 MG tablet Take 2 tablets (200 mg total) by mouth at bedtime. 11/24/23   Franchot Isaiah LABOR, MD    Family History Family History  Problem Relation Age of Onset   Congestive Heart Failure Father    Colon cancer Maternal Uncle     Social History Social History[1]   Allergies   Penicillins and Antihistamines, chlorpheniramine-type   Review of Systems Review of Systems  Constitutional:  Negative for activity change, appetite change, chills, diaphoresis, fatigue, fever and unexpected weight change.  HENT:  Positive for congestion, rhinorrhea, sinus pressure, sinus pain and sore throat. Negative for dental problem, drooling, ear discharge, ear pain, facial swelling, hearing loss, mouth sores, nosebleeds, postnasal drip, sneezing, tinnitus, trouble swallowing and voice change.   Respiratory:  Positive for cough. Negative for apnea, choking, chest tightness, shortness of breath, wheezing and stridor.   Gastrointestinal:  Positive for diarrhea. Negative for abdominal distention, abdominal pain, anal bleeding, blood in stool, constipation, nausea, rectal pain and vomiting.     Physical Exam Triage Vital Signs ED Triage Vitals  Encounter Vitals Group     BP 02/19/24 1358 103/67     Girls Systolic BP Percentile --      Girls Diastolic BP Percentile --      Boys Systolic BP Percentile --      Boys Diastolic BP Percentile --      Pulse Rate 02/19/24 1358 90     Resp 02/19/24 1358 18     Temp 02/19/24 1358 97.7 F (36.5 C)     Temp Source 02/19/24 1358 Oral     SpO2 02/19/24 1356 97 %     Weight --      Height --      Head Circumference --      Peak Flow --      Pain Score 02/19/24 1355 7     Pain Loc --      Pain Education --      Exclude from  Growth Chart --    No data found.  Updated Vital Signs BP 103/67 (BP Location: Right Arm)   Pulse 90   Temp 97.7 F (36.5 C) (Oral)   Resp 18   SpO2 97%   Visual Acuity Right Eye Distance:   Left Eye Distance:   Bilateral Distance:    Right Eye Near:   Left Eye Near:    Bilateral Near:     Physical Exam Constitutional:      Appearance: Normal appearance.  HENT:     Head: Normocephalic.     Right Ear: Tympanic membrane, ear canal and external ear normal.     Left Ear: Tympanic membrane, ear canal and external ear normal.     Nose: Congestion present.  Mouth/Throat:     Pharynx: No oropharyngeal exudate or posterior oropharyngeal erythema.  Eyes:     Extraocular Movements: Extraocular movements intact.  Cardiovascular:     Rate and Rhythm: Normal rate and regular rhythm.     Pulses: Normal pulses.     Heart sounds: Normal heart sounds.  Pulmonary:     Effort: Pulmonary effort is normal.     Breath sounds: Normal breath sounds.     Comments: Wheezing with cough Musculoskeletal:     Cervical back: Normal range of motion and neck supple.  Neurological:     Mental Status: He is alert and oriented to person, place, and time. Mental status is at baseline.      UC Treatments / Results  Labs (all labs ordered are listed, but only abnormal results are displayed) Labs Reviewed  POCT INFLUENZA A/B  POC SOFIA SARS ANTIGEN FIA    EKG   Radiology No results found.  Procedures Procedures (including critical care time)  Medications Ordered in UC Medications - No data to display  Initial Impression / Assessment and Plan / UC Course  I have reviewed the triage vital signs and the nursing notes.  Pertinent labs & imaging results that were available during my care of the patient were reviewed by me and considered in my medical decision making (see chart for details).  Viral URI with cough, COPD exacerbation  Vitals are stable, O2 saturation 97% on room air,  lungs clear to auscultation wheezing only heard with coughing at this time, stable for outpatient management, COVID and flu testing negative, discussed findings, empirically placed on azithromycin  prescribed prednisone , Tessalon  and wife Fenesin codeine, PDMP reviewed, low risk recommended continued use of albuterol  inhaler with follow-up as needed Final Clinical Impressions(s) / UC Diagnoses   Final diagnoses:  Viral URI with cough   Discharge Instructions   None    ED Prescriptions   None    PDMP not reviewed this encounter.     [1]  Social History Tobacco Use   Smoking status: Former    Current packs/day: 0.00    Average packs/day: 1 pack/day for 30.0 years (30.0 ttl pk-yrs)    Types: Cigarettes    Start date: 10/02/1989    Quit date: 10/03/2019    Years since quitting: 4.3    Passive exposure: Past   Smokeless tobacco: Never  Vaping Use   Vaping status: Every Day   Start date: 10/11/2020   Substances: Nicotine  Substance Use Topics   Alcohol use: No   Drug use: No     Teresa Shelba SAUNDERS, NP 02/19/24 1447  "

## 2024-02-19 NOTE — ED Triage Notes (Signed)
 Patient reports sore throat, chills, body aches and cough x 2 days. Patient also reports diarrhea on yesterday. Patient sore throat 7/10 and body aches 8/10. Patient has not taken symptoms.

## 2024-11-18 ENCOUNTER — Ambulatory Visit: Admitting: Dermatology
